# Patient Record
Sex: Female | Born: 1980 | Race: Black or African American | Hispanic: No | Marital: Single | Smoking: Never smoker
Health system: Southern US, Community
[De-identification: ages and names within clinical notes are randomized; demographics above are authoritative.]

## PROBLEM LIST (undated history)

## (undated) DIAGNOSIS — D649 Anemia, unspecified: Secondary | ICD-10-CM

## (undated) HISTORY — PX: BREAST SURGERY: SHX581

## (undated) HISTORY — PX: OTHER SURGICAL HISTORY: SHX169

## (undated) HISTORY — PX: WISDOM TOOTH EXTRACTION: SHX21

---

## 1999-04-10 ENCOUNTER — Other Ambulatory Visit: Admission: RE | Admit: 1999-04-10 | Discharge: 1999-04-10 | Payer: Self-pay | Admitting: *Deleted

## 1999-05-09 ENCOUNTER — Encounter: Payer: Self-pay | Admitting: Obstetrics and Gynecology

## 1999-05-10 ENCOUNTER — Ambulatory Visit (HOSPITAL_COMMUNITY): Admission: RE | Admit: 1999-05-10 | Discharge: 1999-05-10 | Payer: Self-pay | Admitting: Obstetrics and Gynecology

## 1999-05-10 ENCOUNTER — Encounter: Payer: Self-pay | Admitting: Obstetrics and Gynecology

## 1999-06-21 ENCOUNTER — Ambulatory Visit (HOSPITAL_BASED_OUTPATIENT_CLINIC_OR_DEPARTMENT_OTHER): Admission: RE | Admit: 1999-06-21 | Discharge: 1999-06-21 | Payer: Self-pay | Admitting: *Deleted

## 1999-09-25 ENCOUNTER — Other Ambulatory Visit: Admission: RE | Admit: 1999-09-25 | Discharge: 1999-09-25 | Payer: Self-pay | Admitting: Obstetrics and Gynecology

## 1999-10-04 ENCOUNTER — Other Ambulatory Visit: Admission: RE | Admit: 1999-10-04 | Discharge: 1999-10-04 | Payer: Self-pay | Admitting: Obstetrics & Gynecology

## 1999-11-13 ENCOUNTER — Ambulatory Visit (HOSPITAL_COMMUNITY): Admission: RE | Admit: 1999-11-13 | Discharge: 1999-11-13 | Payer: Self-pay | Admitting: Obstetrics & Gynecology

## 1999-11-21 ENCOUNTER — Inpatient Hospital Stay (HOSPITAL_COMMUNITY): Admission: EM | Admit: 1999-11-21 | Discharge: 1999-11-21 | Payer: Self-pay | Admitting: Obstetrics & Gynecology

## 1999-12-13 ENCOUNTER — Emergency Department (HOSPITAL_COMMUNITY): Admission: EM | Admit: 1999-12-13 | Discharge: 1999-12-13 | Payer: Self-pay | Admitting: Emergency Medicine

## 1999-12-14 ENCOUNTER — Encounter: Payer: Self-pay | Admitting: Emergency Medicine

## 2000-11-18 ENCOUNTER — Other Ambulatory Visit: Admission: RE | Admit: 2000-11-18 | Discharge: 2000-11-18 | Payer: Self-pay | Admitting: Plastic Surgery

## 2001-01-05 ENCOUNTER — Other Ambulatory Visit: Admission: RE | Admit: 2001-01-05 | Discharge: 2001-01-05 | Payer: Self-pay | Admitting: Obstetrics and Gynecology

## 2001-05-10 ENCOUNTER — Other Ambulatory Visit: Admission: RE | Admit: 2001-05-10 | Discharge: 2001-05-10 | Payer: Self-pay | Admitting: Obstetrics and Gynecology

## 2001-11-09 ENCOUNTER — Other Ambulatory Visit: Admission: RE | Admit: 2001-11-09 | Discharge: 2001-11-09 | Payer: Self-pay | Admitting: Obstetrics and Gynecology

## 2002-11-30 ENCOUNTER — Other Ambulatory Visit: Admission: RE | Admit: 2002-11-30 | Discharge: 2002-11-30 | Payer: Self-pay | Admitting: Obstetrics and Gynecology

## 2011-11-06 ENCOUNTER — Other Ambulatory Visit: Payer: Self-pay | Admitting: Obstetrics and Gynecology

## 2011-11-06 DIAGNOSIS — Z1231 Encounter for screening mammogram for malignant neoplasm of breast: Secondary | ICD-10-CM

## 2011-11-13 ENCOUNTER — Ambulatory Visit (INDEPENDENT_AMBULATORY_CARE_PROVIDER_SITE_OTHER): Payer: BC Managed Care – PPO | Admitting: Licensed Clinical Social Worker

## 2011-11-13 DIAGNOSIS — F331 Major depressive disorder, recurrent, moderate: Secondary | ICD-10-CM

## 2011-11-13 DIAGNOSIS — F411 Generalized anxiety disorder: Secondary | ICD-10-CM

## 2011-11-17 ENCOUNTER — Encounter: Payer: Self-pay | Admitting: Family

## 2011-11-17 ENCOUNTER — Ambulatory Visit: Payer: Self-pay | Admitting: Licensed Clinical Social Worker

## 2011-11-17 ENCOUNTER — Ambulatory Visit (INDEPENDENT_AMBULATORY_CARE_PROVIDER_SITE_OTHER): Payer: BC Managed Care – PPO | Admitting: Family

## 2011-11-17 VITALS — BP 100/70 | HR 85 | Temp 98.3°F | Ht 62.0 in | Wt 143.0 lb

## 2011-11-17 DIAGNOSIS — F419 Anxiety disorder, unspecified: Secondary | ICD-10-CM

## 2011-11-17 DIAGNOSIS — F3289 Other specified depressive episodes: Secondary | ICD-10-CM

## 2011-11-17 DIAGNOSIS — F329 Major depressive disorder, single episode, unspecified: Secondary | ICD-10-CM

## 2011-11-17 DIAGNOSIS — F411 Generalized anxiety disorder: Secondary | ICD-10-CM

## 2011-11-17 LAB — CBC WITH DIFFERENTIAL/PLATELET
Basophils Absolute: 0 10*3/uL (ref 0.0–0.1)
Basophils Relative: 1 % (ref 0.0–3.0)
Eosinophils Absolute: 0.1 10*3/uL (ref 0.0–0.7)
Eosinophils Relative: 1.8 % (ref 0.0–5.0)
HCT: 36.1 % (ref 36.0–46.0)
Hemoglobin: 12.1 g/dL (ref 12.0–15.0)
Lymphocytes Relative: 36.1 % (ref 12.0–46.0)
Lymphs Abs: 1.4 10*3/uL (ref 0.7–4.0)
MCHC: 33.6 g/dL (ref 30.0–36.0)
MCV: 81.5 fl (ref 78.0–100.0)
Monocytes Absolute: 0.4 10*3/uL (ref 0.1–1.0)
Monocytes Relative: 9.9 % (ref 3.0–12.0)
Neutro Abs: 2 10*3/uL (ref 1.4–7.7)
Neutrophils Relative %: 51.2 % (ref 43.0–77.0)
Platelets: 374 10*3/uL (ref 150.0–400.0)
RBC: 4.43 Mil/uL (ref 3.87–5.11)
RDW: 14.4 % (ref 11.5–14.6)
WBC: 3.9 10*3/uL — ABNORMAL LOW (ref 4.5–10.5)

## 2011-11-17 LAB — TSH: TSH: 1.82 u[IU]/mL (ref 0.35–5.50)

## 2011-11-17 LAB — COMPREHENSIVE METABOLIC PANEL
AST: 16 U/L (ref 0–37)
Albumin: 3.7 g/dL (ref 3.5–5.2)
Alkaline Phosphatase: 51 U/L (ref 39–117)
Potassium: 3.6 mEq/L (ref 3.5–5.1)
Sodium: 137 mEq/L (ref 135–145)
Total Protein: 7.2 g/dL (ref 6.0–8.3)

## 2011-11-17 MED ORDER — CLONAZEPAM 0.5 MG PO TABS: 0.5000 mg | ORAL_TABLET | Freq: Two times a day (BID) | ORAL | Status: DC | PRN

## 2011-11-17 MED ORDER — ESCITALOPRAM OXALATE 10 MG PO TABS: 10.0000 mg | ORAL_TABLET | Freq: Every day | ORAL | Status: DC

## 2011-11-17 NOTE — Patient Instructions (Signed)
Anxiety and Panic Attacks   Your caregiver has informed you that you are having an anxiety or panic attack. There may be many forms of this. Most of the time these attacks come suddenly and without warning. They come at any time of day, including periods of sleep, and at any time of life. They may be strong and unexplained. Although panic attacks are very scary, they are physically harmless. Sometimes the cause of your anxiety is not known. Anxiety is a protective mechanism of the body in its fight or flight mechanism. Most of these perceived danger situations are actually nonphysical situations (such as anxiety over losing a job).   CAUSES   The causes of an anxiety or panic attack are many. Panic attacks may occur in otherwise healthy people given a certain set of circumstances. There may be a genetic cause for panic attacks. Some medications may also have anxiety as a side effect.   SYMPTOMS   Some of the most common feelings are:   Intense terror.   Dizziness, feeling faint.   Hot and cold flashes.   Fear of going crazy.   Feelings that nothing is real.   Sweating.   Shaking.   Chest pain or a fast heartbeat (palpitations).   Smothering, choking sensations.   Feelings of impending doom and that death is near.   Tingling of extremities, this may be from over-breathing.   Altered reality (derealization).   Being detached from yourself (depersonalization).   Several symptoms can be present to make up anxiety or panic attacks.   DIAGNOSIS   The evaluation by your caregiver will depend on the type of symptoms you are experiencing. The diagnosis of anxiety or panic attack is made when no physical illness can be determined to be a cause of the symptoms.   TREATMENT   Treatment to prevent anxiety and panic attacks may include:   Avoidance of circumstances that cause anxiety.   Reassurance and relaxation.   Regular exercise.   Relaxation therapies, such as yoga.   Psychotherapy with a psychiatrist or therapist.    Avoidance of caffeine, alcohol and illegal drugs.   Prescribed medication.   SEEK IMMEDIATE MEDICAL CARE IF:   You experience panic attack symptoms that are different than your usual symptoms.   You have any worsening or concerning symptoms.   Document Released: 04/07/2005 Document Revised: 03/27/2011 Document Reviewed: 08/09/2009   ExitCare® Patient Information ©2012 [employer].   Depression, Adolescent and Adult   Depression is a true and treatable medical condition. In general there are two kinds of depression:   Depression we all experience in some form. For example depression from the death of a loved one, financial distress or natural disasters will trigger or increase depression.   Clinical depression, on the other hand, appears without an apparent cause or reason. This depression is a disease. Depression may be caused by chemical imbalance in the body and brain or may come as a response to a physical illness. Alcohol and other drugs can cause depression.   DIAGNOSIS   The diagnosis of depression is usually based upon symptoms and medical history.   TREATMENT   Treatments for depression fall into three categories. These are:   Drug therapy. There are many medicines that treat depression. Responses may vary and sometimes trial and error is necessary to determine the best medicines and dosage for a particular patient.   Psychotherapy, also called talking treatments, helps people resolve their problems by looking at them from   a different point of view and by giving people insight into their own personal makeup. Traditional psychotherapy looks at a childhood source of a problem. Other psychotherapy will look at current conflicts and move toward solving those. If the cause of depression is drug use, counseling is available to help abstain. In time the depression will usually improve. If there were underlying causes for the chemical use, they can be addressed.   ECT (electroconvulsive therapy) or shock  treatment is not as commonly used today. It is a very effective treatment for severe suicidal depression. During ECT electrical impulses are applied to the head. These impulses cause a generalized seizure. It can be effective but causes a loss of memory for recent events. Sometimes this loss of memory may include the last several months.   Treat all depression or suicide threats as serious. Obtain professional help. Do not wait to see if serious depression will get better over time without help. Seek help for yourself or those around you.   In the [place] the number to the National Suicide Help Lines With 24 Hour Help Are:   1-800-SUICIDE   1-800-784-2433   Document Released: 04/04/2000 Document Revised: 03/27/2011 Document Reviewed: 11/24/2007   ExitCare® Patient Information ©2012 [employer].

## 2011-11-17 NOTE — Progress Notes (Signed)
  Subjective:    Patient ID: Misty Lopez, female    DOB: 1980/11/22, 31 y.o.   MRN: [id number]  HPI 31 year old AAF, non-smoker is in today with c/o anxiety, depression, chest tightness, and panic attacks over the last month. She reports an increase in stress at work with a new boss. She is employed as a Production designer, theatre/television/film for [employer]. She denies and feelings of helplessness, hopelessness, thoughts of death or dying.    Review of Systems  Constitutional: Negative.   Respiratory: Negative.   Cardiovascular: Negative.   Gastrointestinal: Negative.   Musculoskeletal: Negative.   Skin: Negative.   Neurological: Negative.   Psychiatric/Behavioral: Positive for disturbed wake/sleep cycle. Negative for self-injury. The patient is nervous/anxious.    No past medical history on file.  History   Social History  . Marital Status: Single    Spouse Name: N/A    Number of Children: N/A  . Years of Education: N/A   Occupational History  . Not on file.   Social History Main Topics  . Smoking status: Never Smoker   . Smokeless tobacco: Not on file  . Alcohol Use: Yes  . Drug Use: No  . Sexually Active: Not on file   Other Topics Concern  . Not on file   Social History Narrative  . No narrative on file    Past Surgical History  Procedure Date  . Breast surgery     reduction    No family history on file.  No Known Allergies  Current Outpatient Prescriptions on File Prior to Visit  Medication Sig Dispense Refill  . norethindrone-ethinyl estradiol (JUNEL FE,GILDESS FE,LOESTRIN FE) 1-20 MG-MCG tablet Take 1 tablet by mouth daily.      . clonazePAM (KLONOPIN) 0.5 MG tablet Take 1 tablet (0.5 mg total) by mouth 2 (two) times daily as needed for anxiety.  30 tablet  1  . escitalopram (LEXAPRO) 10 MG tablet Take 1 tablet (10 mg total) by mouth daily.  30 tablet  3    BP 100/70  Pulse 85  Temp 98.3 F (36.8 C) (Oral)  Ht 5\' 2"  (1.575 m)  Wt 143 lb (64.864 kg)  BMI 26.15 kg/m2  SpO2  97%chart    Objective:   Physical Exam  Constitutional: She is oriented to person, place, and time. She appears well-developed and well-nourished.  HENT:  Right Ear: External ear normal.  Left Ear: External ear normal.  Nose: Nose normal.  Mouth/Throat: Oropharynx is clear and moist.  Neck: Normal range of motion. Neck supple. No thyromegaly present.  Cardiovascular: Normal rate, regular rhythm and normal heart sounds.   Pulmonary/Chest: Effort normal and breath sounds normal.  Abdominal: Soft. Bowel sounds are normal.  Neurological: She is alert and oriented to person, place, and time.  Skin: Skin is warm and dry.  Psychiatric: She has a normal mood and affect.          Assessment & Plan:  Assessment: Depression, Anxiety  Plan: Lexapro 10mg  1 tab po QD. Klonopin .5mg  BID as needed. Out of work x 2 weeks. Call the office if symptoms worsen or persist. Recheck as scheduled and sooner as necessary.

## 2011-11-20 ENCOUNTER — Ambulatory Visit
Admission: RE | Admit: 2011-11-20 | Discharge: 2011-11-20 | Disposition: A | Discharge status: Home / Self Care | Payer: BC Managed Care – PPO | Source: Ambulatory Visit | Attending: Obstetrics and Gynecology | Admitting: Obstetrics and Gynecology

## 2011-11-20 DIAGNOSIS — Z1231 Encounter for screening mammogram for malignant neoplasm of breast: Secondary | ICD-10-CM

## 2011-11-24 ENCOUNTER — Ambulatory Visit (INDEPENDENT_AMBULATORY_CARE_PROVIDER_SITE_OTHER): Payer: BC Managed Care – PPO | Admitting: Licensed Clinical Social Worker

## 2011-11-24 DIAGNOSIS — F411 Generalized anxiety disorder: Secondary | ICD-10-CM

## 2011-11-24 DIAGNOSIS — F331 Major depressive disorder, recurrent, moderate: Secondary | ICD-10-CM

## 2011-11-25 ENCOUNTER — Other Ambulatory Visit: Payer: Self-pay | Admitting: Obstetrics and Gynecology

## 2011-11-25 DIAGNOSIS — R928 Other abnormal and inconclusive findings on diagnostic imaging of breast: Secondary | ICD-10-CM

## 2011-12-01 ENCOUNTER — Ambulatory Visit: Payer: BC Managed Care – PPO | Admitting: Family

## 2011-12-03 ENCOUNTER — Ambulatory Visit: Payer: BC Managed Care – PPO | Admitting: Family

## 2011-12-03 ENCOUNTER — Ambulatory Visit: Payer: BC Managed Care – PPO | Admitting: Licensed Clinical Social Worker

## 2011-12-05 ENCOUNTER — Ambulatory Visit (INDEPENDENT_AMBULATORY_CARE_PROVIDER_SITE_OTHER): Payer: BC Managed Care – PPO | Admitting: Licensed Clinical Social Worker

## 2011-12-05 DIAGNOSIS — F411 Generalized anxiety disorder: Secondary | ICD-10-CM

## 2011-12-05 DIAGNOSIS — F331 Major depressive disorder, recurrent, moderate: Secondary | ICD-10-CM

## 2011-12-15 ENCOUNTER — Ambulatory Visit (INDEPENDENT_AMBULATORY_CARE_PROVIDER_SITE_OTHER): Payer: BC Managed Care – PPO | Admitting: Family

## 2011-12-15 ENCOUNTER — Encounter: Payer: Self-pay | Admitting: Family

## 2011-12-15 VITALS — BP 122/82 | Temp 98.8°F | Wt 144.0 lb

## 2011-12-15 DIAGNOSIS — F329 Major depressive disorder, single episode, unspecified: Secondary | ICD-10-CM

## 2011-12-15 DIAGNOSIS — F411 Generalized anxiety disorder: Secondary | ICD-10-CM

## 2011-12-15 DIAGNOSIS — F419 Anxiety disorder, unspecified: Secondary | ICD-10-CM

## 2011-12-15 MED ORDER — ESCITALOPRAM OXALATE 10 MG PO TABS: 20.0000 mg | ORAL_TABLET | Freq: Every day | ORAL | Status: DC

## 2011-12-15 NOTE — Progress Notes (Signed)
  Subjective:    Patient ID: Misty Lopez, female    DOB: 1981-01-12, 31 y.o.   MRN: [id number]  HPI 31 year old [place] American female, nonsmoker is in for recheck of anxiety and depression. She was started on Lexapro 10 mg once daily. Patient denies current medications a week after her office visit here. She's been on medication for about 3 weeks consistently. She's taking Klonopin on an as-needed basis has taken about 5 tablets in 3 weeks. He'll psych her mood overall has improved but continues to have good days and bad days. Her mother had a stroke 2 weeks ago and is improving. She lives in [place]. 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 This constellation to return to work. She is seeing Judithe Modest for therapy.   Review of Systems  Constitutional: Negative.   Respiratory: Negative.   Cardiovascular: Negative.   Hematological: Negative.   Psychiatric/Behavioral: Positive for disturbed wake/sleep cycle. Negative for suicidal ideas and confusion. The patient is nervous/anxious.    No past medical history on file.  History   Social History  . Marital Status: Single    Spouse Name: N/A    Number of Children: N/A  . Years of Education: N/A   Occupational History  . Not on file.   Social History Main Topics  . Smoking status: Never Smoker   . Smokeless tobacco: Not on file  . Alcohol Use: Yes  . Drug Use: No  . Sexually Active: Not on file   Other Topics Concern  . Not on file   Social History Narrative  . No narrative on file    Past Surgical History  Procedure Date  . Breast surgery     reduction    No family history on file.  No Known Allergies  Current Outpatient Prescriptions on File Prior to Visit  Medication Sig Dispense Refill  . clonazePAM (KLONOPIN) 0.5 MG tablet Take 1 tablet (0.5 mg total) by mouth 2 (two) times daily as needed for anxiety.  30 tablet  1  . norethindrone-ethinyl  estradiol (JUNEL FE,GILDESS FE,LOESTRIN FE) 1-20 MG-MCG tablet Take 1 tablet by mouth daily.      Marland Kitchen DISCONTD: escitalopram (LEXAPRO) 10 MG tablet Take 1 tablet (10 mg total) by mouth daily.  30 tablet  3    BP 122/82  Temp 98.8 F (37.1 C) (Oral)  Wt 144 lb (65.318 kg)  LMP 08/17/2013chart    Objective:   Physical Exam  Constitutional: She is oriented to person, place, and time. She appears well-developed and well-nourished.  Cardiovascular: Normal rate, regular rhythm and normal heart sounds.   Pulmonary/Chest: Effort normal and breath sounds normal.  Neurological: She is alert and oriented to person, place, and time.  Skin: Skin is warm and dry.  Psychiatric: She has a normal mood and affect.          Assessment & Plan:  Assessment: Anxiety and Depression  Plan:Recheck in 3 weeks. Increase Lexapro to 20mg  once a day. Klonopin as needed.

## 2011-12-15 NOTE — Patient Instructions (Addendum)

## 2011-12-19 ENCOUNTER — Ambulatory Visit (INDEPENDENT_AMBULATORY_CARE_PROVIDER_SITE_OTHER): Payer: BC Managed Care – PPO | Admitting: Licensed Clinical Social Worker

## 2011-12-19 DIAGNOSIS — F411 Generalized anxiety disorder: Secondary | ICD-10-CM

## 2011-12-19 DIAGNOSIS — F331 Major depressive disorder, recurrent, moderate: Secondary | ICD-10-CM

## 2011-12-29 ENCOUNTER — Ambulatory Visit: Payer: BC Managed Care – PPO | Admitting: Licensed Clinical Social Worker

## 2012-01-05 ENCOUNTER — Encounter: Payer: Self-pay | Admitting: Family

## 2012-01-05 ENCOUNTER — Ambulatory Visit (INDEPENDENT_AMBULATORY_CARE_PROVIDER_SITE_OTHER): Payer: BC Managed Care – PPO | Admitting: Family

## 2012-01-05 ENCOUNTER — Ambulatory Visit: Payer: BC Managed Care – PPO | Admitting: Licensed Clinical Social Worker

## 2012-01-05 VITALS — BP 114/80 | HR 95 | Wt 144.0 lb

## 2012-01-05 DIAGNOSIS — F419 Anxiety disorder, unspecified: Secondary | ICD-10-CM

## 2012-01-05 DIAGNOSIS — F329 Major depressive disorder, single episode, unspecified: Secondary | ICD-10-CM

## 2012-01-05 DIAGNOSIS — F341 Dysthymic disorder: Secondary | ICD-10-CM

## 2012-01-05 DIAGNOSIS — F418 Other specified anxiety disorders: Secondary | ICD-10-CM | POA: Insufficient documentation

## 2012-01-05 DIAGNOSIS — F411 Generalized anxiety disorder: Secondary | ICD-10-CM

## 2012-01-05 NOTE — Progress Notes (Signed)
Subjective:    Patient ID: Misty Lopez, female    DOB: 22-Mar-1981, 31 y.o.   MRN: [id number]  HPI 31 year old AAF with c/o of depression and anxiety related to stress at work. Pt was increased to Lexapro 20mg  daily and PRN Klonopin. Reports not taking Lexapro and taking Klonopin daily. Stopped Lexapro without taper. Depression worse. Pt has been on leave from work approx. 6 weeks and continues to experience periods stress when thinking about works and speaking with coworkers. Pt states she was listening to family members who object to her use of medication to deal with stress. Reports increase in appetite, decrease in energy and experiencing moment of panic when trying to complete daily activities. Also reports that co-worker committed suicide  2 weeks ago.    Review of Systems  Constitutional: Positive for activity change, appetite change and fatigue.  HENT: Negative.   Eyes: Negative.   Respiratory: Negative.   Cardiovascular: Negative.   Gastrointestinal: Negative.   Genitourinary: Negative.   Musculoskeletal: Negative.   Skin: Negative.   Neurological: Negative.   Hematological: Negative.   Psychiatric/Behavioral: Negative.    No past medical history on file.  History   Social History  . Marital Status: Single    Spouse Name: N/A    Number of Children: N/A  . Years of Education: N/A   Occupational History  . Not on file.   Social History Main Topics  . Smoking status: Never Smoker   . Smokeless tobacco: Not on file  . Alcohol Use: Yes  . Drug Use: No  . Sexually Active: Not on file   Other Topics Concern  . Not on file   Social History Narrative  . No narrative on file    Past Surgical History  Procedure Date  . Breast surgery     reduction    No family history on file.  No Known Allergies  Current Outpatient Prescriptions on File Prior to Visit  Medication Sig Dispense Refill  . escitalopram (LEXAPRO) 10 MG tablet Take 2 tablets (20 mg total) by  mouth daily.  30 tablet  3  . LO LOESTRIN FE 1 MG-10 MCG / 10 MCG tablet       . norethindrone-ethinyl estradiol (JUNEL FE,GILDESS FE,LOESTRIN FE) 1-20 MG-MCG tablet Take 1 tablet by mouth daily.      . clonazePAM (KLONOPIN) 0.5 MG tablet Take 1 tablet (0.5 mg total) by mouth 2 (two) times daily as needed for anxiety.  30 tablet  1    BP 114/80  Pulse 95  Wt 144 lb (65.318 kg)  SpO2 99%  LMP 08/17/2013chart    Objective:   Physical Exam  Constitutional: She is oriented to person, place, and time. She appears well-developed and well-nourished.  HENT:  Head: Normocephalic.  Right Ear: External ear normal.  Left Ear: External ear normal.  Eyes: EOM are normal. Pupils are equal, round, and reactive to light.  Neck: Normal range of motion. Neck supple.  Cardiovascular: Normal rate, regular rhythm and normal heart sounds.   Pulmonary/Chest: Effort normal and breath sounds normal.  Abdominal: Soft. Bowel sounds are normal.  Musculoskeletal: Normal range of motion.  Neurological: She is alert and oriented to person, place, and time.  Skin: Skin is warm and dry.  Psychiatric:       Dysphoric          Assessment & Plan:   Assessment; Depression, Anxiety  Plan; Extend leave from work for 6 weeks. Pt agrees to  begin to take Lexapro. Follow up 6 weeks or sooner is symptoms persist, become worst. Continue seeing therapist.

## 2012-01-06 DIAGNOSIS — Z0279 Encounter for issue of other medical certificate: Secondary | ICD-10-CM

## 2012-01-14 ENCOUNTER — Ambulatory Visit
Admission: RE | Admit: 2012-01-14 | Discharge: 2012-01-14 | Disposition: A | Discharge status: Home / Self Care | Payer: BC Managed Care – PPO | Source: Ambulatory Visit | Attending: Obstetrics and Gynecology | Admitting: Obstetrics and Gynecology

## 2012-01-14 DIAGNOSIS — R928 Other abnormal and inconclusive findings on diagnostic imaging of breast: Secondary | ICD-10-CM

## 2012-02-16 ENCOUNTER — Encounter: Payer: Self-pay | Admitting: Family

## 2012-02-16 ENCOUNTER — Ambulatory Visit (INDEPENDENT_AMBULATORY_CARE_PROVIDER_SITE_OTHER): Payer: BC Managed Care – PPO | Admitting: Family

## 2012-02-16 VITALS — BP 112/78 | HR 100 | Wt 146.0 lb

## 2012-02-16 DIAGNOSIS — F411 Generalized anxiety disorder: Secondary | ICD-10-CM

## 2012-02-16 DIAGNOSIS — F419 Anxiety disorder, unspecified: Secondary | ICD-10-CM

## 2012-02-16 DIAGNOSIS — F329 Major depressive disorder, single episode, unspecified: Secondary | ICD-10-CM

## 2012-02-16 NOTE — Progress Notes (Signed)
  Subjective:    Patient ID: Misty Lopez, female    DOB: June 14, 1980, 31 y.o.   MRN: [id number]  HPI 31 year old [place] American female, nonsmoker is in for recheck of anxiety and depression. She's currently takes Lexapro that has helped to decrease the frequency of panic attacks. She now has panic attacks 1 every 2-3 weeks. She's also been started on Remeron 15 mg to help him sleep and tolerating it well. Uses Klonopin as needed. Patient still feels like she has anxiety at the thoughts of returning back to work. Has no feelings of helplessness hopelessness, thoughts of death or dying.   Review of Systems  Constitutional: Negative.   Respiratory: Negative.   Cardiovascular: Negative.   Gastrointestinal: Negative.   Hematological: Negative.   Psychiatric/Behavioral: Positive for disturbed wake/sleep cycle and agitation. Negative for suicidal ideas and self-injury. The patient is nervous/anxious.    No past medical history on file.  History   Social History  . Marital Status: Single    Spouse Name: N/A    Number of Children: N/A  . Years of Education: N/A   Occupational History  . Not on file.   Social History Main Topics  . Smoking status: Never Smoker   . Smokeless tobacco: Not on file  . Alcohol Use: Yes  . Drug Use: No  . Sexually Active: Not on file   Other Topics Concern  . Not on file   Social History Narrative  . No narrative on file    Past Surgical History  Procedure Date  . Breast surgery     reduction    No family history on file.  No Known Allergies  Current Outpatient Prescriptions on File Prior to Visit  Medication Sig Dispense Refill  . escitalopram (LEXAPRO) 10 MG tablet Take 2 tablets (20 mg total) by mouth daily.  30 tablet  3  . LO LOESTRIN FE 1 MG-10 MCG / 10 MCG tablet       . clonazePAM (KLONOPIN) 0.5 MG tablet Take 1 tablet (0.5 mg total) by mouth 2 (two) times daily as needed for anxiety.  30 tablet  1  . mirtazapine (REMERON) 15 MG  tablet       . norethindrone-ethinyl estradiol (JUNEL FE,GILDESS FE,LOESTRIN FE) 1-20 MG-MCG tablet Take 1 tablet by mouth daily.        BP 112/78  Pulse 100  Wt 146 lb (66.225 kg)  SpO2 98%chart    Objective:   Physical Exam  Constitutional: She is oriented to person, place, and time. She appears well-developed and well-nourished.  Pulmonary/Chest: Effort normal.  Neurological: She is alert and oriented to person, place, and time.  Psychiatric: She has a normal mood and affect.          Assessment & Plan:  Assessment: Anxiety and Depression  Plan: Continue current meds. See psychiatry for further management. I will manage any other medical needs.

## 2012-03-10 ENCOUNTER — Ambulatory Visit (INDEPENDENT_AMBULATORY_CARE_PROVIDER_SITE_OTHER): Payer: BC Managed Care – PPO | Admitting: Psychiatry

## 2012-03-10 DIAGNOSIS — F4322 Adjustment disorder with anxiety: Secondary | ICD-10-CM

## 2012-03-10 MED ORDER — LORAZEPAM 0.5 MG PO TABS: ORAL_TABLET | ORAL | Status: DC

## 2012-03-10 NOTE — Progress Notes (Signed)
Psychiatric Assessment Adult  Patient Identification:  Misty Lopez Date of Evaluation:  03/10/2012 Chief Complaint:Anxiety History of Chief Complaint:  No chief complaint on file. today this 31 year old African American female was seen in a psychiatric evaluation. This patient acknowledges episodic anxiety attacks occurring now approximately once a week. The patient says anxiety is sometimes associated with chest pain and sometimes with shortness of breath. It is very evident that there is a significant stressor occurring since July when all this started. Over the last number of months she has been confronted by a Agricultural consultant who is her boss about changing her employees and changing the way she works. 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 The patient believes this is not which should happen in in principal refuses to acknowledge his demands. In July when she felt more pressure she became extremely anxious and maneuvered her primary care doctor to write her out for 2 weeks. On the day that she was written now hours later she went shopping and had an acute anxiety attack. He was characterized by shortness of breath feeling very depressed crying a great deal and being paralyzed with anxiety for least 30-45 minutes. He clearly was not a panic attack. Approximately a week later her mother had a stroke. At this time the patient has been renown by her primary care provider. She was told by the disability company that she has stopped seeing her therapist and instead see a psychiatrist to determine the need to continue her disability. At that time she apparently went to some setting in [place] where she saw a psychiatrist to short times and saw 3 different therapists at 3 different times. The patient is very frustrated with her psychiatric care in [place] and has chosen to come here to be evaluated. At one point she saw Misty Lopez a local therapist but no  longer. This patient has never been married has no children. 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 She likes her job and likes her employees who work for her. She's a great dislike and distrust for her district manager was been threatening to her about requiring changes. She is going to human resources without success. Generally the job as a secure 1. The patient presently has no romantic relationships but has a number of close social friends. The patient denies persistent daily depression. She says her sleep is somewhat disrupted but it does not affect her daytime functioning. In essence she does not take naps nor does she feel sleepy. Her appetite is normal as is her energy. She denies anhedonia she enjoys a social life watching TV and her dog. She denies any problems with her ability to think and concentrate or make decisions. She denies worthlessness. She denies being suicidal now or ever. The patient denies the use of alcohol or drugs. She is no evidence of psychotic symptoms. She denies a clear episode in the past of major depression or of mania. She denies symptoms consistent with generalized anxiety disorder or of panic disorder. She denies obsessive-compulsive symptoms. Masses she has mild anxiety with an anxiety attack occurs approximately once a week. Her primary care doctor has placed her on Lexapro at low dose and Remeron neither of which have had any benefit.  HPI Review of Systems Physical Exam  Depressive Symptoms: anxiety,  (Hypo) Manic Symptoms:   Elevated Mood:  No Irritable Mood:  No Grandiosity:  No Distractibility:  No Labiality of Mood:  No Delusions:  No Hallucinations:  No Impulsivity:  No Sexually Inappropriate Behavior:  No Financial Extravagance:  No Flight of Ideas:  No  Anxiety Symptoms: Excessive Worry:  No Panic Symptoms:  No Agoraphobia:  No Obsessive Compulsive: No  Symptoms: None, Specific Phobias:  No Social Anxiety:   No  Psychotic Symptoms:  Hallucinations: No None Delusions:  No Paranoia:  No   Ideas of Reference:  No  PTSD Symptoms: Ever had a traumatic exposure:  No Had a traumatic exposure in the last month:  No Re-experiencing:  None Hypervigilance:  No Hyperarousal: No None Avoidance: No None  Traumatic Brain Injury: No   Past Psychiatric History: Diagnosis: none  Hospitalizations:   Outpatient Care:   Substance Abuse Care:   Self-Mutilation:   Suicidal Attempts:   Violent Behaviors:    Past Medical History:  No past medical history on file. History of Loss of Consciousness:   Seizure History:   Cardiac History:   Allergies:  No Known Allergies Current Medications:  Current Outpatient Prescriptions  Medication Sig Dispense Refill  . clonazePAM (KLONOPIN) 0.5 MG tablet Take 1 tablet (0.5 mg total) by mouth 2 (two) times daily as needed for anxiety.  30 tablet  1  . escitalopram (LEXAPRO) 10 MG tablet Take 2 tablets (20 mg total) by mouth daily.  30 tablet  3  . LO LOESTRIN FE 1 MG-10 MCG / 10 MCG tablet       . mirtazapine (REMERON) 15 MG tablet       . norethindrone-ethinyl estradiol (JUNEL FE,GILDESS FE,LOESTRIN FE) 1-20 MG-MCG tablet Take 1 tablet by mouth daily.        Previous Psychotropic Medications:  Medication Dose                          Substance Abuse History in the last 12 months: Substance Age of 1st Use Last Use Amount Specific Type    Medical Consequences of Substance Abuse:   Legal Consequences of Substance Abuse:   Family Consequences of Substance Abuse:   Blackouts:   DT's:   Withdrawal Symptoms:   None  Social History: Current Place of Residence: [place] of Birth:  Family Members:  Marital Status:  Single Children: none  Sons:   Daughters:  Relationships:  Education:  [employer] Problems/Performance:  Religious Beliefs/Practices:  History of Abuse: none Teacher, music History:    Legal History:  Hobbies/Interests:   Family History:  No family history on file.  Mental Status Examination/Evaluation: Objective:  Appearance: Casual  Eye Contact::  Good  Speech:  Normal Rate  Volume:  Normal  Mood:  normal  Affect:  Appropriate  Thought Process:  Coherent  Orientation:  Full  Thought Content:  WDL  Suicidal Thoughts:  No  Homicidal Thoughts:  No  Judgement:  Good  Insight:  Good  Psychomotor Activity:  Normal  Akathisia:  No  Handed:  Right  AIMS (if indicated):    Assets:  Desire for Improvement    Laboratory/X-Ray Psychological Evaluation(s)        Assessment:  Axis I: Adjustment Disorder with Anxiety  AXIS I Adjustment Disorder with Anxiety  AXIS II Deferred  AXIS III No past medical history on file.   AXIS IV occupational problems  AXIS V 51-60 moderate symptoms   Treatment Plan/Recommendations:  Plan of Care: at this time I believe the patient has situational anxiety. I do not believe she has plans for what  happened if she was terminated from her present employer. I think she is a single woman who is very anxious about losing this job. Fortunately her mother has recovered. At this time or as per did discontinue her Lexapro and her Remeron. Today we'll start her on Ativan 0.5 mg twice a day and 0.5 mg when necessary if she has an anxiety attack. At this time we've asked her to go back to [facility] for therapy. We've been fairly clear to her that this setting is not a place to make disability evaluations. This is setting for diagnosis and treatment. It is possible after long-term relationship to right individuals out for disability but I do not think this patient is truly disabled from any major mental illness. She's agreed to go back to her primary care provider her initially birder out to continue writing her out. At this time her chronic state of anxiety is not disabling and I do not believe it should prevent her from returning to her work setting.   Laboratory:    Psychotherapy: none  Medications: Lexapro 10 mg, Remeron 15 mg  Routine PRN Medications:    Consultations:   Safety Concerns:    Other:      Juanice Warburton, [place], MD 11/20/20131:54 PM

## 2012-04-06 ENCOUNTER — Telehealth: Payer: Self-pay | Admitting: Family

## 2012-04-06 NOTE — Telephone Encounter (Signed)
Ok for patient to pick up a note. Written permission has to be given to fax directly

## 2012-04-06 NOTE — Telephone Encounter (Signed)
Pt is requesting a work note sent to her job stating the reason she was taking out of work on 11/17/11.  please fax attn: Christel Mormon fax # is [phone].

## 2012-04-06 NOTE — Telephone Encounter (Signed)
Left message to advise pt of NP's note

## 2012-05-05 ENCOUNTER — Ambulatory Visit (HOSPITAL_COMMUNITY): Payer: BC Managed Care – PPO | Admitting: Psychiatry

## 2013-10-05 ENCOUNTER — Ambulatory Visit: Payer: Self-pay | Admitting: Family

## 2013-10-07 ENCOUNTER — Ambulatory Visit: Payer: 59 | Admitting: Family

## 2013-10-07 DIAGNOSIS — Z0289 Encounter for other administrative examinations: Secondary | ICD-10-CM

## 2013-11-03 ENCOUNTER — Other Ambulatory Visit: Payer: Self-pay | Admitting: Obstetrics and Gynecology

## 2013-11-03 DIAGNOSIS — D259 Leiomyoma of uterus, unspecified: Secondary | ICD-10-CM

## 2013-11-04 ENCOUNTER — Other Ambulatory Visit: Payer: Self-pay | Admitting: Obstetrics and Gynecology

## 2013-11-04 ENCOUNTER — Ambulatory Visit
Admission: RE | Admit: 2013-11-04 | Discharge: 2013-11-04 | Disposition: A | Discharge status: Home / Self Care | Payer: 59 | Source: Ambulatory Visit | Attending: Obstetrics and Gynecology | Admitting: Obstetrics and Gynecology

## 2013-11-04 DIAGNOSIS — D259 Leiomyoma of uterus, unspecified: Secondary | ICD-10-CM

## 2013-11-04 MED ORDER — IOHEXOL 300 MG/ML  SOLN: 100.0000 mL | Freq: Once | INTRAMUSCULAR | Status: DC | PRN

## 2014-01-05 ENCOUNTER — Other Ambulatory Visit: Payer: Self-pay

## 2014-01-11 ENCOUNTER — Encounter: Payer: Self-pay | Admitting: Family

## 2014-01-11 DIAGNOSIS — Z0289 Encounter for other administrative examinations: Secondary | ICD-10-CM

## 2014-12-03 IMAGING — CT CT ABD-PELV W/O CM
2 of 4 series · 17 of 46 positions shown, 19 images · non-contrast
Comparison: None.

CLINICAL DATA: Unable is a obtain IV access. Abdominal drain.
Uterine fibroids

EXAM:
CT ABDOMEN AND PELVIS WITHOUT CONTRAST
TECHNIQUE: Multidetector CT imaging of the abdomen and pelvis was performed
following the standard protocol without IV contrast.

[Series 2: abd/pelvis without · axial · non-contrast · 0.70mm/px · z∈[-394,-54]mm · 14 of 76 slices shown, 16 images]
[im 4/76  soft-tissue]
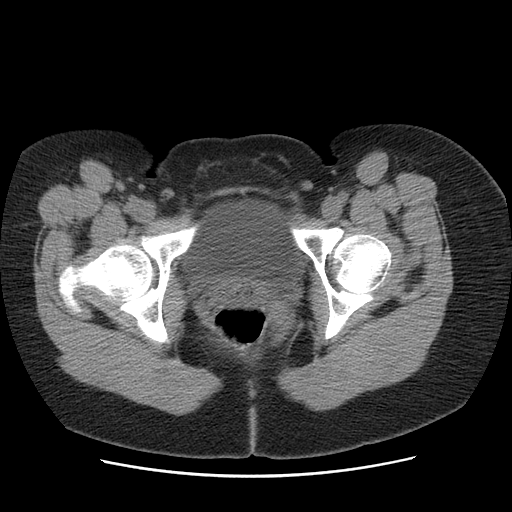
[im 4/76  bone]
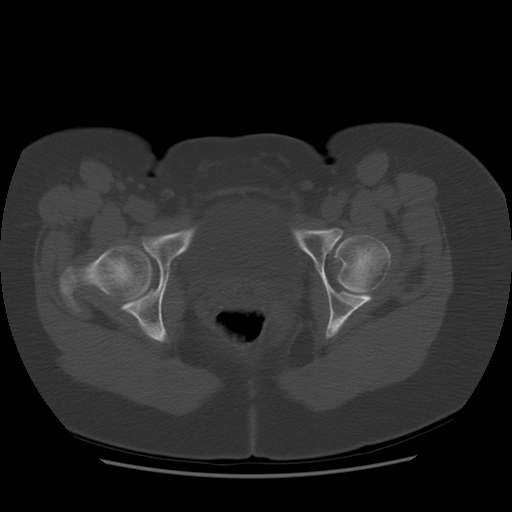
[im 10/76  soft-tissue]
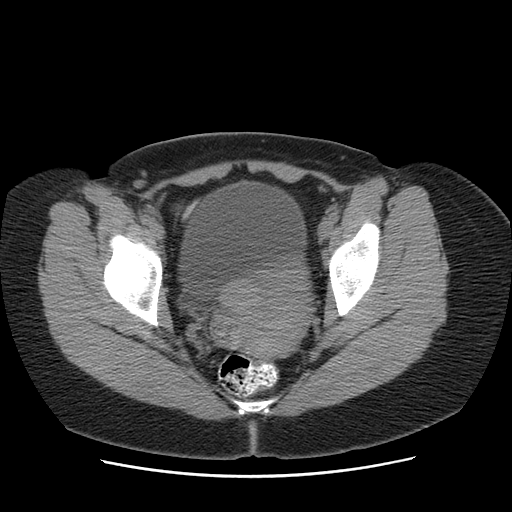
[im 14/76  soft-tissue]
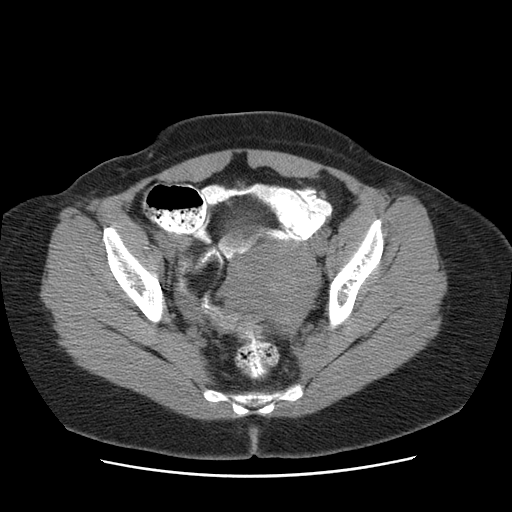
[im 20/76  soft-tissue]
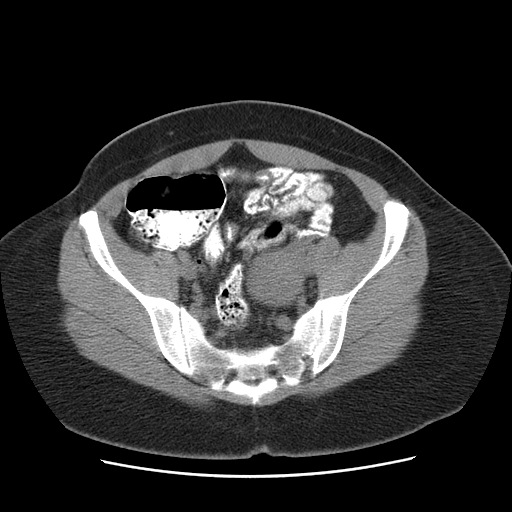
[im 27/76  soft-tissue]
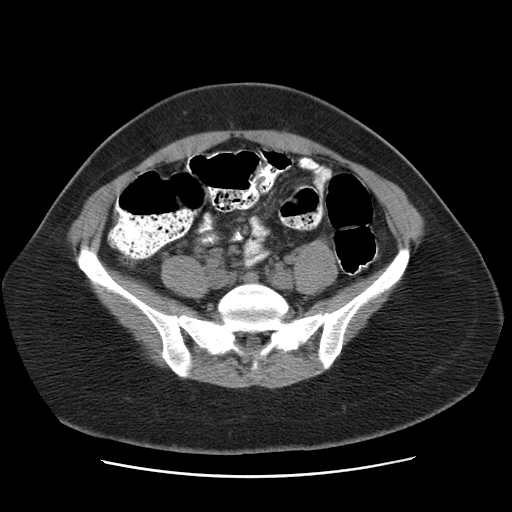
[im 30/76  soft-tissue]
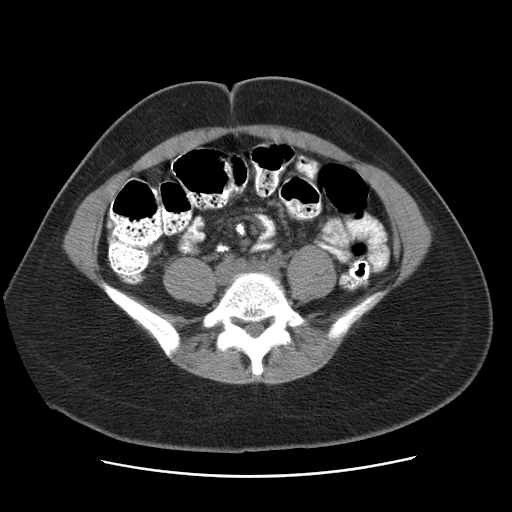
[im 36/76  soft-tissue]
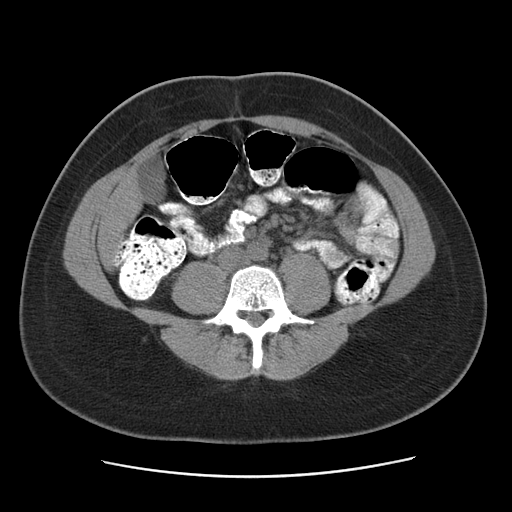
[im 40/76  soft-tissue]
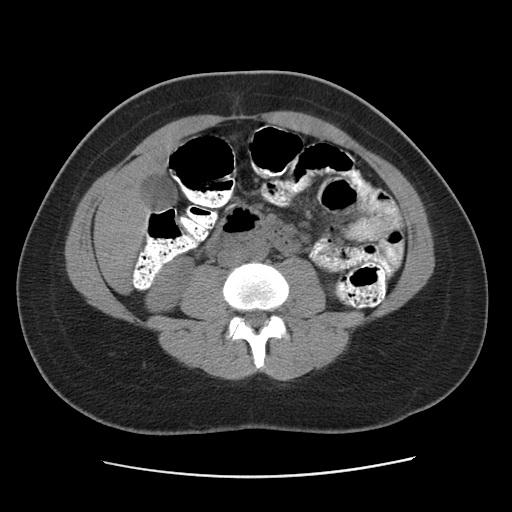
[im 46/76  soft-tissue]
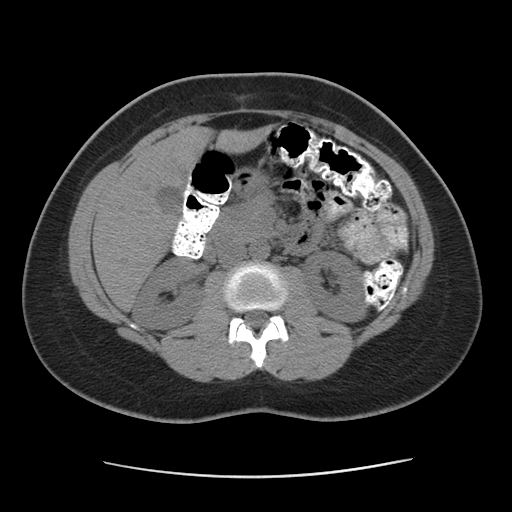
[im 46/76  bone]
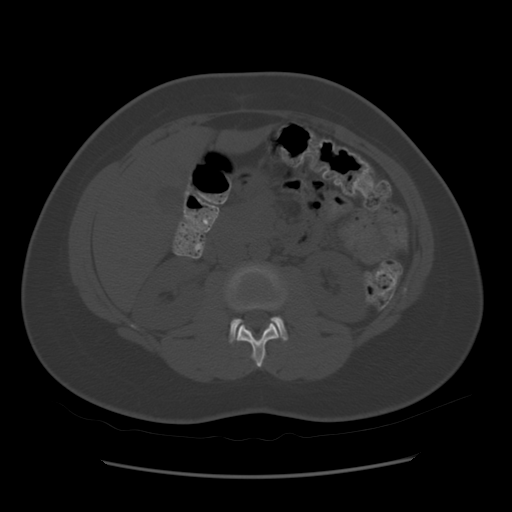
[im 49/76  soft-tissue]
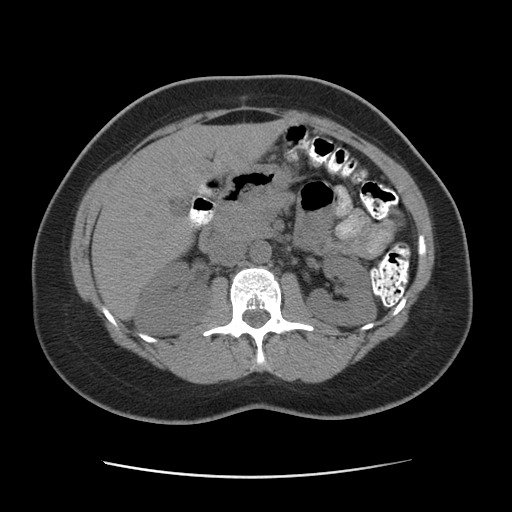
[im 56/76  soft-tissue]
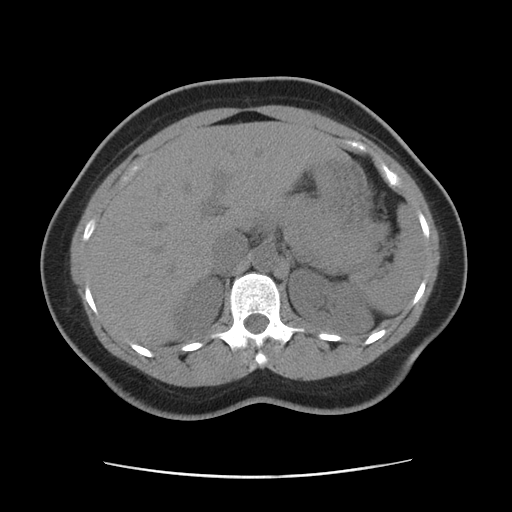
[im 62/76  soft-tissue]
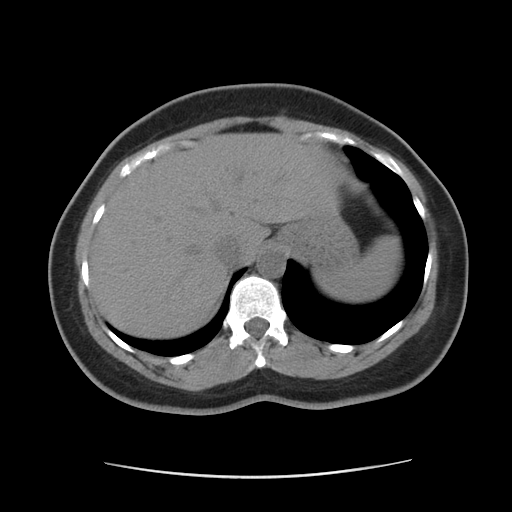
[im 66/76  soft-tissue]
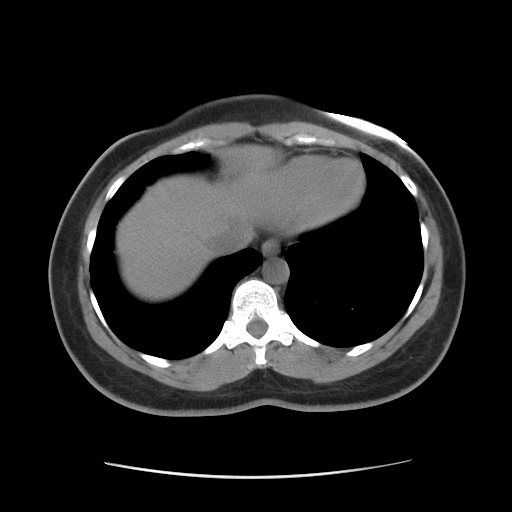
[im 72/76  soft-tissue]
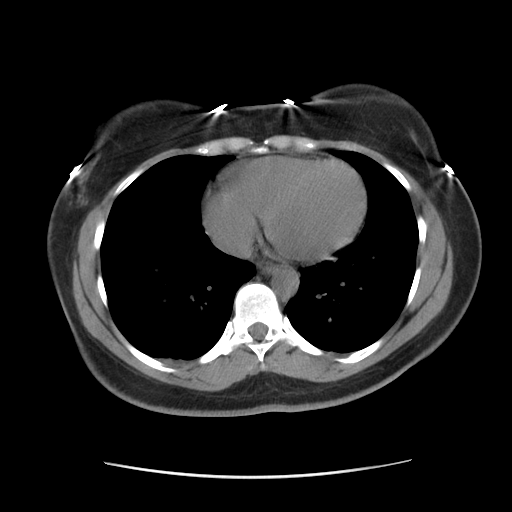

[Series 400: cor · coronal · 0.94mm/px · 3 of 144 slices shown]
[im 48/144  soft-tissue]
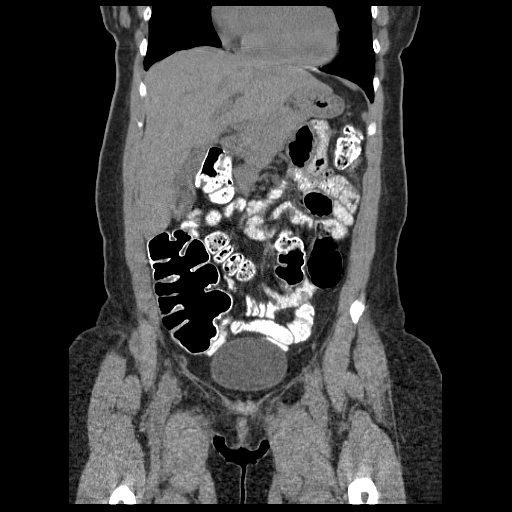
[im 64/144  soft-tissue]
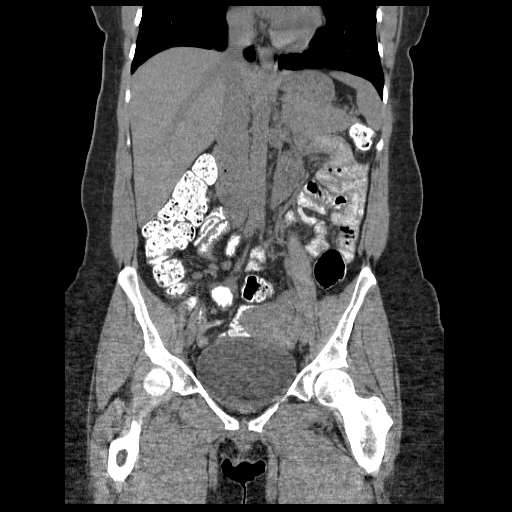
[im 80/144  soft-tissue]
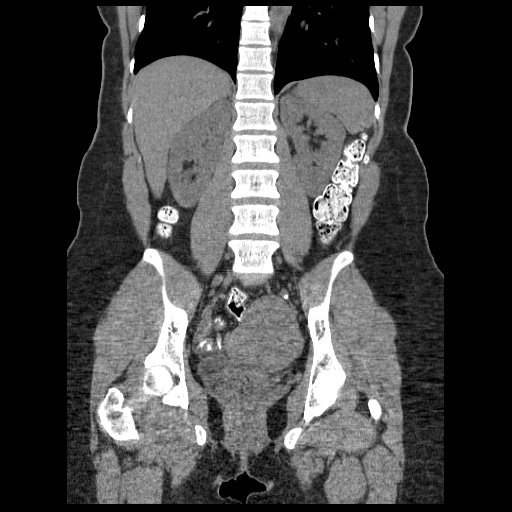

[17 of 46 positions shown; findings below may reference images not displayed]

FINDINGS: Uterus: Uterus has a lobular consistent with leiomyomas. These
leiomyomas are not well demonstrate without IV contrast. There is
one partially exophytic leiomyoma along the left border of the urine
body. The others are likely intramural. The uterus is normal in
volume at the 9.9 by 6.6 by 5.9 cm. The endometrial canal not
assessed.

Lung bases are clear. No pericardial fluid. No focal hepatic lesion
on this noncontrast exam. The gallbladder, pancreas, spleen, adrenal
glands, kidneys are normal.

The stomach, small bowel, and cecum are normal. The appendix is
normal. The colon and rectosigmoid colon are normal.

Abdominal or is normal caliber. No retroperitoneal periportal
lymphadenopathy.

No free fluid the pelvis. Bladder is normal. No pelvic
lymphadenopathy. No aggressive osseous lesion.
IMPRESSION: Lobular uterus consistent with intramural and partially exophytic
leiomyomas. The uterus is not well evaluated on this noncontrast CT.
MRI or pelvic ultrasound would be better choices for evaluation of
the uterus.

No acute abdominal process otherwise.

Normal appendix.

## 2015-02-20 ENCOUNTER — Inpatient Hospital Stay (HOSPITAL_COMMUNITY)
Admission: AD | Admit: 2015-02-20 | Discharge: 2015-02-20 | Disposition: A | Discharge status: Home / Self Care | Payer: Managed Care, Other (non HMO) | Source: Ambulatory Visit | Attending: Obstetrics | Admitting: Obstetrics

## 2015-02-20 ENCOUNTER — Encounter (HOSPITAL_COMMUNITY): Payer: Self-pay

## 2015-02-20 ENCOUNTER — Other Ambulatory Visit: Payer: Self-pay | Admitting: Obstetrics and Gynecology

## 2015-02-20 DIAGNOSIS — D649 Anemia, unspecified: Secondary | ICD-10-CM | POA: Diagnosis not present

## 2015-02-20 HISTORY — DX: Anemia, unspecified: D64.9

## 2015-02-20 MED ORDER — SODIUM CHLORIDE 0.9 % IV SOLN
510.0000 mg | Freq: Once | INTRAVENOUS | Status: AC
  Administered 2015-02-20: 510 mg via INTRAVENOUS
  Filled 2015-02-20: qty 17

## 2015-02-26 ENCOUNTER — Inpatient Hospital Stay (HOSPITAL_COMMUNITY)
Admission: AD | Admit: 2015-02-26 | Discharge: 2015-02-26 | Disposition: A | Discharge status: Home / Self Care | Payer: Managed Care, Other (non HMO) | Source: Ambulatory Visit | Attending: Obstetrics | Admitting: Obstetrics

## 2015-02-26 DIAGNOSIS — D649 Anemia, unspecified: Secondary | ICD-10-CM | POA: Insufficient documentation

## 2015-02-26 MED ORDER — SODIUM CHLORIDE 0.9 % IV SOLN
510.0000 mg | Freq: Once | INTRAVENOUS | Status: AC
  Administered 2015-02-26: 510 mg via INTRAVENOUS
  Filled 2015-02-26: qty 17

## 2015-02-26 MED ORDER — SODIUM CHLORIDE 0.9 % IV SOLN
INTRAVENOUS | Status: DC
  Administered 2015-02-26: 12:00:00 via INTRAVENOUS

## 2015-02-26 NOTE — MAU Note (Signed)
Patient presents for scheduled iron infusion. Denies complications.

## 2018-06-16 ENCOUNTER — Ambulatory Visit
Admission: EM | Admit: 2018-06-16 | Discharge: 2018-06-16 | Disposition: A | Discharge status: Home / Self Care | Payer: Managed Care, Other (non HMO) | Attending: Emergency Medicine | Admitting: Emergency Medicine

## 2018-06-16 DIAGNOSIS — S161XXA Strain of muscle, fascia and tendon at neck level, initial encounter: Secondary | ICD-10-CM | POA: Diagnosis not present

## 2018-06-16 MED ORDER — CYCLOBENZAPRINE HCL 5 MG PO TABS: 5.0000 mg | ORAL_TABLET | Freq: Every day | ORAL | 0 refills | Status: DC

## 2018-06-16 MED ORDER — IBUPROFEN 800 MG PO TABS: 800.0000 mg | ORAL_TABLET | Freq: Three times a day (TID) | ORAL | 0 refills | Status: AC
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
## 2018-06-16 NOTE — Discharge Instructions (Signed)
Light and regular activity as tolerated.  Heat with activity may be helpful.  Ibuprofen three times a day, take with food.  Flexeril as a muscle relaxer as needed. May cause drowsiness. Please do not take if driving or drinking alcohol.   If symptoms worsen or do not improve in the next 2 weeks to return to be seen or to follow up with your PCP.
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
## 2018-07-19 ENCOUNTER — Telehealth: Payer: Managed Care, Other (non HMO) | Admitting: Family

## 2018-07-19 DIAGNOSIS — K047 Periapical abscess without sinus: Secondary | ICD-10-CM

## 2018-07-19 MED ORDER — CLINDAMYCIN HCL 300 MG PO CAPS: 300.0000 mg | ORAL_CAPSULE | Freq: Three times a day (TID) | ORAL | 0 refills | Status: DC

## 2018-07-19 NOTE — Progress Notes (Signed)
I have sent in Clindamycin 300 mg three times a day. If this does not improve, see your doctor

## 2018-07-31 ENCOUNTER — Telehealth: Payer: Managed Care, Other (non HMO) | Admitting: Adult Health

## 2018-07-31 DIAGNOSIS — R599 Enlarged lymph nodes, unspecified: Secondary | ICD-10-CM

## 2018-07-31 DIAGNOSIS — K047 Periapical abscess without sinus: Secondary | ICD-10-CM

## 2018-07-31 NOTE — Progress Notes (Signed)
Based on what you shared with me, I feel your condition warrants further evaluation and I recommend that you be seen for a face to face office visit. I understand you did become some better after the clindamycin but since this condition is not resolved for your safety you should be seen today.      NOTE: If you entered your credit card information for this eVisit, you will not be charged. You may see a "hold" on your card for the $35 but that hold will drop off and you will not have a charge processed.  If you are having a true medical emergency please call 911.  If you need an urgent face to face visit,  has four urgent care centers for your convenience.    PLEASE NOTE: THE [facility]STACARE LOCATIONS AND URGENT CARE CLINICS DO NOT HAVE THE TESTING FOR CORONAVIRUS COVID19 AVAILABLE.  IF YOU FEEL YOU NEED THIS TEST YOU MUST GO TO A TRIAGE LOCATION AT [place]   [email or web address] to reserve your spot online an avoid wait times  [facility] [address] [place], [place] [address] Modified hours of operation: Monday-Friday, 10 AM to 6 PM  Saturday & Sunday 10 AM to 4 PM *Across the street from [place] (New Address!) [address] [place], [place] [address] *Just off [address], across the road from Standard hours of operation: Monday-Friday, 10 AM to 5 PM  Closed Saturday & Sunday   The following sites will take your insurance:  . [facility] [place] a Provider at this Location  [address] [place], [place] [address] . 10 am to 8 pm Monday-Friday . 12 pm to 8 pm Saturday-Sunday   . [facility] at [place] a Provider at this Location  [address] [place], [place], [place] [address] . 8 am to 8 pm Monday-Friday . 9  am to 6 pm Saturday . 11 am to 6 pm Sunday   . [facility] at [place] Get Driving Directions  [address] [place], [place] [address] . 8 am to 8 pm Monday-Friday . 8 am to 4 pm Saturday-Sunday   Your e-visit answers were reviewed by a board certified advanced clinical practitioner to complete your personal care plan.  Thank you for using e-Visits.

## 2018-08-03 ENCOUNTER — Ambulatory Visit
Admission: EM | Admit: 2018-08-03 | Discharge: 2018-08-03 | Disposition: A | Discharge status: Home / Self Care | Payer: Managed Care, Other (non HMO) | Attending: Physician Assistant | Admitting: Physician Assistant

## 2018-08-03 DIAGNOSIS — M542 Cervicalgia: Secondary | ICD-10-CM

## 2018-08-03 MED ORDER — DICLOFENAC SODIUM 1 % TD GEL: 2.0000 g | Freq: Four times a day (QID) | TRANSDERMAL | 0 refills | Status: DC

## 2018-08-03 MED ORDER — METHOCARBAMOL 500 MG PO TABS: 500.0000 mg | ORAL_TABLET | Freq: Two times a day (BID) | ORAL | 0 refills | Status: DC

## 2018-08-03 NOTE — ED Provider Notes (Signed)
[facility]    CSN: [id number] Arrival date & time: 08/03/18  1635     History   Chief Complaint Chief Complaint  Patient presents with  . Dental Pain    HPI Misty Lopez is a 38 y.o. female.   38 year old female comes in for few day history of right neck pain.  Patient states she has a crown that she needs to replace on her right bottom tooth, she weeks ago, she cracked the tooth while eating, and wonders if that could be causing her right neck pain.  She denies any dental pain, swelling of the throat, tripoding, drooling, trismus.  She notes a mass to the right submandibular area where she wonders if it is a lymph node.  She denies any painful palpation.  States for the past few days, she has had increased neck stiffness, and now with pain in decreased range of motion.  Pain is mostly to the right, worse with movement.  She denies any injury/trauma.  Denies radiation of pain.  She does note that she has been waking up in different positions sleeping, and wonders if that could also be causing symptoms.  She tried a pill of leftover Flexeril without relief.  Took ibuprofen 800 mg once without relief.  Denies fever, chills, night sweats.  Denies cough, congestion, sore throat.  Denies headache, photophobia.     Past Medical History:  Diagnosis Date  . Anemia     Patient Active Problem List   Diagnosis Date Noted  . Depression with anxiety 01/05/2012    Past Surgical History:  Procedure Laterality Date  . BREAST SURGERY     reduction  . WISDOM TOOTH EXTRACTION      OB History    Gravida  0   Para      Term      Preterm      AB      Living        SAB      TAB      Ectopic      Multiple      Live Births               Home Medications    Prior to Admission medications   Medication Sig Start Date End Date Taking? Authorizing Provider  Cholecalciferol (VITAMIN D3) 10000 UNITS TABS Take 2 tablets by mouth daily.    [provider]  clindamycin (CLEOCIN) 300 MG capsule Take 1 capsule (300 mg total) by mouth 3 (three) times daily. 07/19/18   Dutch Quint B, FNP  diclofenac sodium (VOLTAREN) 1 % GEL Apply 2 g topically 4 (four) times daily. 08/03/18   Tasia Catchings, Kyrene Longan V, PA-C  Ferrous Sulfate (IRON) 325 (65 FE) MG TABS Take 2 tablets by mouth daily.    [provider]  Hydrocodone-Acetaminophen 5-300 MG TABS Take 1-2 tablets by mouth every 4 (four) hours as needed (for pain).  02/23/15   [provider]  ibuprofen (ADVIL,MOTRIN) 800 MG tablet Take 1 tablet (800 mg total) by mouth 3 (three) times daily. 06/16/18   Zigmund Gottron, NP  medroxyPROGESTERone (DEPO-PROVERA) 150 MG/ML injection Inject 150 mg into the muscle every 3 (three) months.    [provider]  methocarbamol (ROBAXIN) 500 MG tablet Take 1 tablet (500 mg total) by mouth 2 (two) times daily. 08/03/18   Ok Edwards, PA-C    Family History No family history on file.  Social History Social History   Tobacco Use  .  Smoking status: Never Smoker  . Smokeless tobacco: Never Used  Substance Use Topics  . Alcohol use: Yes    Comment: socially   . Drug use: No     Allergies   Patient has no known allergies.   Review of Systems Review of Systems  Reason unable to perform ROS: See HPI as above.     Physical Exam Triage Vital Signs ED Triage Vitals [08/03/18 1643]  Enc Vitals Group     BP (!) 141/92     Pulse Rate 84     Resp 18     Temp 98.3 F (36.8 C)     Temp Source Oral     SpO2 99 %     Weight      Height      Head Circumference      Peak Flow      Pain Score 8     Pain Loc      Pain Edu?      Excl. in [place]?    No data found.  Updated Vital Signs BP (!) 141/92 (BP Location: Left Arm)   Pulse 84   Temp 98.3 F (36.8 C) (Oral)   Resp 18   LMP 07/13/2018   SpO2 99%   Physical Exam Constitutional:      General: She is not in acute distress.    Appearance: She is well-developed. She is not  diaphoretic.  HENT:     Head: Normocephalic and atraumatic.     Mouth/Throat:     Mouth: Mucous membranes are moist.     Pharynx: Oropharynx is clear. Uvula midline.      Comments: No tenderness to palpation of gumline/tooth.  No facial swelling.  Floor of mouth soft to palpation.  Small round mass with hyperpigmentation to the right lower chin/jaw, adjacent to the submandibular lymph node, consistent with scarring from acne/ingrown hair. No fluctuance felt. No tenderness to palpation. No obvious cervical lymphadenopathy.  Eyes:     Conjunctiva/sclera: Conjunctivae normal.     Pupils: Pupils are equal, round, and reactive to light.  Neck:     Comments: No tenderness to palpation of spinous processes.  Tenderness to palpation of right neck.  Decreased flexion to the left due to pain and tightness.  Pulmonary:     Effort: Pulmonary effort is normal. No accessory muscle usage, prolonged expiration or respiratory distress.  Neurological:     Mental Status: She is alert and oriented to person, place, and time.    UC Treatments / Results  Labs (all labs ordered are listed, but only abnormal results are displayed) Labs Reviewed - No data to display  EKG None  Radiology No results found.  Procedures Procedures (including critical care time)  Medications Ordered in UC Medications - No data to display  Initial Impression / Assessment and Plan / UC Course  I have reviewed the triage vital signs and the nursing notes.  Pertinent labs & imaging results that were available during my care of the patient were reviewed by me and considered in my medical decision making (see chart for details).    Discussed with patient, history and exam most consistent with neck strain.  Lower suspicion for dental infection/lymphadenopathy causing symptoms.  Will have patient continue ibuprofen as directed, can supplement with Voltaren gel and Tylenol as needed for further pain relief.  Will discontinue  Effexor at this time, and can try Robaxin as needed.  Warm compress.  Return precautions given.  Patient expresses  understanding and agrees to plan.  Final Clinical Impressions(s) / UC Diagnoses   Final diagnoses:  Neck pain on right side    ED Prescriptions    Medication Sig Dispense Auth. Provider   methocarbamol (ROBAXIN) 500 MG tablet Take 1 tablet (500 mg total) by mouth 2 (two) times daily. 20 tablet Yeshua Stryker V, PA-C   diclofenac sodium (VOLTAREN) 1 % GEL Apply 2 g topically 4 (four) times daily. 1 Tube Tobin Chad, PA-C 08/03/18 1721

## 2018-08-03 NOTE — ED Triage Notes (Signed)
Pt c/o rt lower tooth pain x3wks, now having rt sided neck stiffness and pain x5days

## 2018-08-03 NOTE — Discharge Instructions (Signed)
Start ibuprofen as directed. Voltaren gel/tylenol as needed for further pain relief. Discontinue flexeril at this time. Robaxin as needed, this can make you drowsy, so do not take if you are going to drive, operate heavy machinery, or make important decisions. Ice/heat compresses as needed. This can take up to 3-4 weeks to completely resolve, but you should be feeling better each week. Follow up here or with PCP if symptoms worsen, changes for reevaluation.

## 2019-01-25 ENCOUNTER — Other Ambulatory Visit: Payer: Self-pay

## 2019-01-25 DIAGNOSIS — Z20822 Contact with and (suspected) exposure to covid-19: Secondary | ICD-10-CM

## 2019-01-27 LAB — NOVEL CORONAVIRUS, NAA: SARS-CoV-2, NAA: NOT DETECTED

## 2019-02-05 ENCOUNTER — Ambulatory Visit (INDEPENDENT_AMBULATORY_CARE_PROVIDER_SITE_OTHER)
Admission: RE | Admit: 2019-02-05 | Discharge: 2019-02-05 | Disposition: A | Discharge status: Home / Self Care | Payer: Self-pay | Source: Ambulatory Visit

## 2019-02-05 DIAGNOSIS — L720 Epidermal cyst: Secondary | ICD-10-CM

## 2019-02-05 DIAGNOSIS — L089 Local infection of the skin and subcutaneous tissue, unspecified: Secondary | ICD-10-CM

## 2019-02-05 MED ORDER — DOXYCYCLINE HYCLATE 100 MG PO CAPS
100.0000 mg | ORAL_CAPSULE | Freq: Two times a day (BID) | ORAL | 0 refills | Status: AC
Start: 2019-02-05 — End: 2019-02-12

## 2019-02-05 NOTE — Discharge Instructions (Signed)
Doxycycline twice daily x 1 week Warm compresses   Follow up if symptoms not resolving

## 2019-02-05 NOTE — ED Provider Notes (Addendum)
Virtual Visit via Video Note:  Misty Lopez  initiated request for Telemedicine visit with [facility] team. I connected with Misty Lopez  on 02/05/2019 at 3:24 PM  for a synchronized telemedicine visit using a video enabled HIPPA compliant telemedicine application. I verified that I am speaking with Misty Lopez  using two identifiers.  C , PA-C  was physically located in a [facility] site and Remonica Haycox was located at a different location.   The limitations of evaluation and management by telemedicine as well as the availability of in-person appointments were discussed. Patient was informed that she  may incur a bill ( including co-pay) for this virtual visit encounter. Misty Lopez  expressed understanding and gave verbal consent to proceed with virtual visit.     History of Present Illness:Misty Lopez  is a 38 y.o. female presents for evaluation of possible infected bump.  Patient states that over the past few days she has developed a lesion that looks similar to a whitehead to her right lower jaw.  She states that she has a history of similar to this in the past.  Back in April she was treated with clindamycin for this bump for possible dental abscess as at the time she had had a broken crown and felt more behind her tooth. symptoms did improve, but the bump remained and had this evaluated.  In the past she has tried popping it and states that a thicker material has come out of it.  She states recently her symptoms have returned.  She does have some associated pain with it.  Denies any redness. States that it is soft to touch.  Denies any dental pain.  Denies difficulty swallowing or oral swelling.  Denies difficulty breathing.  Denies neck stiffness.  Denies fevers.  No Known Allergies   Past Medical History:  Diagnosis Date  . Anemia      Social History   Tobacco Use  . Smoking status: Never Smoker  . Smokeless tobacco:  Never Used  Substance Use Topics  . Alcohol use: Yes    Comment: socially   . Drug use: No        Observations/Objective: Physical Exam  Constitutional: She is oriented to person, place, and time and well-developed, well-nourished, and in no distress. No distress.  HENT:  Head: Normocephalic and atraumatic.  Eyes: Pupils are equal, round, and reactive to light.  Neck: Normal range of motion.  Full active range of motion of neck, no neck swelling or erythema noted  Pulmonary/Chest: Effort normal. No respiratory distress.  Speaking in full sentences  Musculoskeletal:     Comments: Moving all extremities appropriately  Neurological: She is alert and oriented to person, place, and time.  Speech clear, face symmetric  Skin:  Patient appears to have superficial enlarged papule to right lower mandibular area with overlying grayish, mild whitish appearance.  Nursing note and vitals reviewed.    Assessment and Plan:    ICD-10-CM   1. 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  We will go ahead and cover with doxycycline to cover any infection that is contributing to increased pain and swelling of recently.  Seems less likely dental abscess given superficial appearance.  We will continue to monitor, discussed following up in person if developing worsening pain or swelling, not resolving.  If symptoms  truly assist discussed may need to follow-up with dermatology if bothersome for removal.  Discussed strict return precautions. Patient verbalized understanding and is agreeable with plan.   Follow Up Instructions:     I discussed the assessment and treatment plan with the patient. The patient was provided an opportunity to ask questions and all were answered. The patient agreed with the plan and demonstrated an understanding of the instructions.   The patient was  advised to call back or seek an in-person evaluation if the symptoms worsen or if the condition fails to improve as anticipated.      Janith Lima, PA-C  02/05/2019 3:24 PM         Debara Pickett C, PA-C 02/05/19 1526    , [place] C, PA-C 02/05/19 1526

## 2019-02-09 ENCOUNTER — Ambulatory Visit
Admission: EM | Admit: 2019-02-09 | Discharge: 2019-02-09 | Disposition: A | Discharge status: Home / Self Care | Payer: Medicaid Other | Attending: Emergency Medicine | Admitting: Emergency Medicine

## 2019-02-09 ENCOUNTER — Telehealth: Payer: Medicaid Other

## 2019-02-09 ENCOUNTER — Other Ambulatory Visit: Payer: Self-pay

## 2019-02-09 DIAGNOSIS — L989 Disorder of the skin and subcutaneous tissue, unspecified: Secondary | ICD-10-CM

## 2019-02-09 MED ORDER — CLINDAMYCIN PHOS-BENZOYL PEROX 1-5 % EX GEL: Freq: Two times a day (BID) | CUTANEOUS | 0 refills | Status: AC

## 2019-02-09 NOTE — ED Triage Notes (Signed)
Pt c/o abscess to rt side of her chin since Monday. tx'd through virtual on Monday with antibiotics but not getting any better.

## 2019-02-09 NOTE — Discharge Instructions (Addendum)
Keep ear clean and dry. May finish course of doxycycline as prescribed. Follow-up with dermatology: They should be calling you to schedule appointment. Return sooner for worsening pain, swelling, redness, fever.

## 2019-02-09 NOTE — ED Provider Notes (Signed)
[facility]    CSN: [id number] Arrival date & time: 02/09/19  1411      History   Chief Complaint Chief Complaint  Patient presents with  . Abscess    HPI Misty Lopez is a 38 y.o. female presenting for right lower jaw skin lesion recheck.  Patient was seen on 02/05/2019 via virtual visit: Please see notes which were reviewed by me at time of appointment.  Patient treated for recurrent skin bump (thought to be infected epithelial inclusion cyst) with doxycycline.  Patient states that she has been taking this as written since Sunday.  Patient states yesterday she had some "really really thick stuff come out of it ".  Patient denies malodor, prolonged bleeding, foreign body.  Patient states that there is a little fleshy but sticking out.  Patient is not add anything to her regimen, been compliant with the antibiotics still.  Patient denies fever, jaw pain, dental pain.  Patient wondering if she can take clindamycin as this is treated better in the past.  Patient has not been followed by dermatology "in years ".   Past Medical History:  Diagnosis Date  . Anemia     Patient Active Problem List   Diagnosis Date Noted  . Depression with anxiety 01/05/2012    Past Surgical History:  Procedure Laterality Date  . BREAST SURGERY     reduction  . WISDOM TOOTH EXTRACTION      OB History    Gravida  0   Para      Term      Preterm      AB      Living        SAB      TAB      Ectopic      Multiple      Live Births               Home Medications    Prior to Admission medications   Medication Sig Start Date End Date Taking? Authorizing Provider  Cholecalciferol (VITAMIN D3) 10000 UNITS TABS Take 2 tablets by mouth daily.    [provider]  clindamycin-benzoyl peroxide (BENZACLIN) gel Apply topically 2 (two) times daily. 02/09/19   Hall-Potvin, [place], PA-C  doxycycline (VIBRAMYCIN) 100 MG capsule Take 1 capsule (100 mg total) by  mouth 2 (two) times daily for 7 days. 02/05/19 02/12/19  Wieters, Hallie C, PA-C  Ferrous Sulfate (IRON) 325 (65 FE) MG TABS Take 2 tablets by mouth daily.    [provider]  ibuprofen (ADVIL,MOTRIN) 800 MG tablet Take 1 tablet (800 mg total) by mouth 3 (three) times daily. 06/16/18   Zigmund Gottron, NP    Family History No family history on file.  Social History Social History   Tobacco Use  . Smoking status: Never Smoker  . Smokeless tobacco: Never Used  Substance Use Topics  . Alcohol use: Yes    Comment: socially   . Drug use: No     Allergies   Patient has no known allergies.   Review of Systems Review of Systems  Constitutional: Negative for fatigue and fever.  HENT: Negative for ear pain, sinus pain, sore throat and voice change.   Eyes: Negative for pain, redness and visual disturbance.  Respiratory: Negative for cough and shortness of breath.   Cardiovascular: Negative for chest pain and palpitations.  Gastrointestinal: Negative for abdominal pain, diarrhea and vomiting.  Musculoskeletal: Negative for arthralgias and myalgias.  Skin: Negative for  color change.       Positive for skin lesion  Neurological: Negative for syncope and headaches.     Physical Exam Triage Vital Signs ED Triage Vitals  Enc Vitals Group     BP      Pulse      Resp      Temp      Temp src      SpO2      Weight      Height      Head Circumference      Peak Flow      Pain Score      Pain Loc      Pain Edu?      Excl. in [place]?    No data found.  Updated Vital Signs BP 123/83 (BP Location: Left Arm)   Pulse 88   Temp 98.3 F (36.8 C) (Oral)   Resp 18   LMP 02/06/2019   SpO2 99%   Visual Acuity Right Eye Distance:   Left Eye Distance:   Bilateral Distance:    Right Eye Near:   Left Eye Near:    Bilateral Near:     Physical Exam Constitutional:      General: She is not in acute distress. HENT:     Head: Normocephalic and atraumatic.  Eyes:      General: No scleral icterus.    Pupils: Pupils are equal, round, and reactive to light.  Cardiovascular:     Rate and Rhythm: Normal rate.  Pulmonary:     Effort: Pulmonary effort is normal.  Skin:    Coloration: Skin is not jaundiced.     Findings: No bruising.     Comments: Small, (<0.5 cm) fleshy lesion protruding from skin.  No active bleeding or discharge.  No surrounding erythema, tenderness, induration/fluctuance.  No TTP.  Neurological:     Mental Status: She is alert and oriented to person, place, and time.      UC Treatments / Results  Labs (all labs ordered are listed, but only abnormal results are displayed) Labs Reviewed - No data to display  EKG   Radiology No results found.  Procedures Procedures (including critical care time)  Medications Ordered in UC Medications - No data to display  Initial Impression / Assessment and Plan / UC Course  I have reviewed the triage vital signs and the nursing notes.  Pertinent labs & imaging results that were available during my care of the patient were reviewed by me and considered in my medical decision making (see chart for details).     Benign, fleshy growth likely related to intralesional pressure.  No discernible abscess to incise today.  Offered to incise lesion, the patient declined after discussing risk/benefit.  We will continue doxycycline course, add clindamycin benzoyl peroxide topically given patient's history of cystic acne.  Internal ambulatory referral made to dermatology as external dermatology has "new patient scheduling after January ".  Low concern for malignancy/infectious process at this time.  Return precautions discussed, patient verbalized understanding and is agreeable to plan. Final Clinical Impressions(s) / UC Diagnoses   Final diagnoses:  Skin lesion     Discharge Instructions     Keep ear clean and dry. May finish course of doxycycline as prescribed. Follow-up with dermatology: They  should be calling you to schedule appointment. Return sooner for worsening pain, swelling, redness, fever.    ED Prescriptions    Medication Sig Dispense Auth. Provider   clindamycin-benzoyl peroxide (BENZACLIN) gel  Apply topically 2 (two) times daily. 25 g Hall-Potvin, [place], PA-C     PDMP not reviewed this encounter.   Hall-Potvin, [place], [place] 02/09/19 1557

## 2019-02-10 ENCOUNTER — Telehealth: Payer: Self-pay | Admitting: Emergency Medicine

## 2019-02-10 NOTE — Telephone Encounter (Signed)
Left voicemail checking in on patient, and encouraged return call with any continuing questions or concerns.    

## 2019-02-10 NOTE — Telephone Encounter (Signed)
Checked in on patient, discussed medications, and encouraged return call with any continuing questions or concerns.    

## 2019-03-15 NOTE — Progress Notes (Signed)
Greater than 5 minutes, yet less than 10 minutes of time have been spent researching, coordinating, and implementing care for this patient today.  Thank you for the details you included in the comment boxes. Those details are very helpful in determining the best course of treatment for you and help us to provide the best care.  

## 2020-12-24 ENCOUNTER — Ambulatory Visit
Admission: EM | Admit: 2020-12-24 | Discharge: 2020-12-24 | Disposition: A | Discharge status: Home / Self Care | Payer: Medicaid Other | Attending: Internal Medicine | Admitting: Internal Medicine

## 2020-12-24 ENCOUNTER — Ambulatory Visit: Payer: Medicaid Other

## 2020-12-24 ENCOUNTER — Other Ambulatory Visit: Payer: Self-pay

## 2020-12-24 ENCOUNTER — Encounter: Payer: Self-pay | Admitting: Emergency Medicine

## 2020-12-24 DIAGNOSIS — R350 Frequency of micturition: Secondary | ICD-10-CM | POA: Insufficient documentation

## 2020-12-24 DIAGNOSIS — Z113 Encounter for screening for infections with a predominantly sexual mode of transmission: Secondary | ICD-10-CM

## 2020-12-24 DIAGNOSIS — N3000 Acute cystitis without hematuria: Secondary | ICD-10-CM

## 2020-12-24 LAB — POCT URINALYSIS DIP (MANUAL ENTRY)
Bilirubin, UA: NEGATIVE
Blood, UA: NEGATIVE
Glucose, UA: NEGATIVE mg/dL
Ketones, POC UA: NEGATIVE mg/dL
Nitrite, UA: NEGATIVE
Protein Ur, POC: NEGATIVE mg/dL
Spec Grav, UA: 1.015 (ref 1.010–1.025)
Urobilinogen, UA: 0.2 E.U./dL
pH, UA: 7.5 (ref 5.0–8.0)

## 2020-12-24 LAB — POCT URINE PREGNANCY: Preg Test, Ur: NEGATIVE

## 2020-12-24 MED ORDER — NITROFURANTOIN MONOHYD MACRO 100 MG PO CAPS: 100.0000 mg | ORAL_CAPSULE | Freq: Two times a day (BID) | ORAL | 0 refills | Status: AC

## 2020-12-24 NOTE — ED Provider Notes (Addendum)
[facility]    CSN: [id number] Arrival date & time: 12/24/20  0901      History   Chief Complaint Chief Complaint  Patient presents with   SEXUALLY TRANSMITTED DISEASE   Urinary Frequency    HPI Misty Lopez is a 40 y.o. female.   Patient presents with 3 to 4-day history of urinary frequency and mild urinary burning.  Patient is also concerned for STDs and would like to be tested.  Patient reports that her significant other has not been faithful, and she is concerned that symptoms could be related to STDs.  Significant other does not have any similar symptoms or confirmed STD.  Denies any pelvic pain, fever, chills but does have some left lower back pain and left lower abdominal pain.  Patient has been having issues with fertility and has been attempting to get pregnant recently.  Patient is not currently on any of the medications for fertility at this time.  Last menstrual cycle was a few weeks prior.   Urinary Frequency   Past Medical History:  Diagnosis Date   Anemia     Patient Active Problem List   Diagnosis Date Noted   Depression with anxiety 01/05/2012    Past Surgical History:  Procedure Laterality Date   BREAST SURGERY     reduction   Open Myomectomy     WISDOM TOOTH EXTRACTION      OB History     Gravida  0   Para      Term      Preterm      AB      Living         SAB      IAB      Ectopic      Multiple      Live Births               Home Medications    Prior to Admission medications   Medication Sig Start Date End Date Taking? Authorizing Provider  Cholecalciferol (VITAMIN D3) 10000 UNITS TABS Take 2 tablets by mouth daily.   Yes [provider]  Ferrous Sulfate (IRON) 325 (65 FE) MG TABS Take 2 tablets by mouth daily.   Yes [provider]  ibuprofen (ADVIL,MOTRIN) 800 MG tablet Take 1 tablet (800 mg total) by mouth 3 (three) times daily. 06/16/18  Yes Burky, Malachy Moan, NP  nitrofurantoin,  macrocrystal-monohydrate, (MACROBID) 100 MG capsule Take 1 capsule (100 mg total) by mouth 2 (two) times daily for 7 days. 12/24/20 12/31/20 Yes Odis Luster, FNP  clindamycin-benzoyl peroxide (BENZACLIN) gel Apply topically 2 (two) times daily. 02/09/19   Hall-Potvin, [place], PA-C    Family History History reviewed. No pertinent family history.  Social History Social History   Tobacco Use   Smoking status: Never   Smokeless tobacco: Never  Substance Use Topics   Alcohol use: Yes    Comment: socially    Drug use: No     Allergies   Patient has no known allergies.   Review of Systems Review of Systems Per HPI  Physical Exam Triage Vital Signs ED Triage Vitals  Enc Vitals Group     BP 12/24/20 0948 134/78     Pulse Rate 12/24/20 0948 84     Resp 12/24/20 0948 20     Temp 12/24/20 0948 98.7 F (37.1 C)     Temp Source 12/24/20 0948 Oral     SpO2 12/24/20 0948 98 %  Weight --      Height --      Head Circumference --      Peak Flow --      Pain Score 12/24/20 0957 0     Pain Loc --      Pain Edu? --      Excl. in [place]? --    No data found.  Updated Vital Signs BP 134/78 (BP Location: Left Arm)   Pulse 84   Temp 98.7 F (37.1 C) (Oral)   Resp 20   SpO2 98%   Visual Acuity Right Eye Distance:   Left Eye Distance:   Bilateral Distance:    Right Eye Near:   Left Eye Near:    Bilateral Near:     Physical Exam Constitutional:      Appearance: Normal appearance.  HENT:     Head: Normocephalic and atraumatic.  Eyes:     Extraocular Movements: Extraocular movements intact.     Conjunctiva/sclera: Conjunctivae normal.  Cardiovascular:     Rate and Rhythm: Normal rate and regular rhythm.     Pulses: Normal pulses.     Heart sounds: Normal heart sounds.  Pulmonary:     Effort: Pulmonary effort is normal. No respiratory distress.     Breath sounds: Normal breath sounds.  Abdominal:     General: Abdomen is flat. Bowel sounds are normal. There is no  distension.     Palpations: Abdomen is soft.     Tenderness: There is no abdominal tenderness.  Genitourinary:    Comments: Deferred with shared decision making. Self swab performed. Musculoskeletal:     Cervical back: Normal.     Thoracic back: Normal.     Lumbar back: Normal.  Skin:    General: Skin is warm and dry.  Neurological:     General: No focal deficit present.     Mental Status: She is alert and oriented to person, place, and time. Mental status is at baseline.  Psychiatric:        Mood and Affect: Mood normal.        Behavior: Behavior normal.        Thought Content: Thought content normal.        Judgment: Judgment normal.     UC Treatments / Results  Labs (all labs ordered are listed, but only abnormal results are displayed) Labs Reviewed  POCT URINALYSIS DIP (MANUAL ENTRY) - Abnormal; Notable for the following components:      Result Value   Leukocytes, UA Trace (*)    All other components within normal limits  URINE CULTURE  POCT URINE PREGNANCY  CERVICOVAGINAL ANCILLARY ONLY    EKG   Radiology No results found.  Procedures Procedures (including critical care time)  Medications Ordered in UC Medications - No data to display  Initial Impression / Assessment and Plan / UC Course  I have reviewed the triage vital signs and the nursing notes.  Pertinent labs & imaging results that were available during my care of the patient were reviewed by me and considered in my medical decision making (see chart for details).     Urinalysis only showing trace leukocytes, so this is not definitive for urinary tract infection.  Will go ahead and treat for UTI as patient has typical UTI symptoms with Macrobid x7 days.  Urine culture and cervicovaginal swab are pending. Will change treatment if appropriate once results are complete.  Urine pregnant was negative.Discussed strict return precautions. Patient verbalized understanding and is  agreeable with plan.  Final  Clinical Impressions(s) / UC Diagnoses   Final diagnoses:  Urinary frequency  Screening examination for venereal disease  Acute cystitis without hematuria     Discharge Instructions      Your urine culture showed some signs of urinary tract infection.  You are being treated with Macrobid antibiotic to treat urinary tract infection.  Your vaginal swab and urine culture pending.  We will call if these are positive.  Urine pregnancy was negative.     ED Prescriptions     Medication Sig Dispense Auth. Provider   nitrofurantoin, macrocrystal-monohydrate, (MACROBID) 100 MG capsule Take 1 capsule (100 mg total) by mouth 2 (two) times daily for 7 days. 14 capsule Odis Luster, FNP      PDMP not reviewed this encounter.   Odis Luster, FNP 12/24/20 1041    Odis Luster, FNP 12/24/20 1056

## 2020-12-24 NOTE — Discharge Instructions (Signed)
Your urine culture showed some signs of urinary tract infection.  You are being treated with Macrobid antibiotic to treat urinary tract infection.  Your vaginal swab and urine culture pending.  We will call if these are positive.  Urine pregnancy was negative.

## 2020-12-24 NOTE — ED Triage Notes (Signed)
Found out a week ago her fiance cheated on her. Reports urinary frequency. Requesting STD check and UA

## 2020-12-26 LAB — CERVICOVAGINAL ANCILLARY ONLY
Bacterial Vaginitis (gardnerella): POSITIVE — AB
Candida Glabrata: NEGATIVE
Candida Vaginitis: NEGATIVE
Chlamydia: NEGATIVE
Comment: NEGATIVE
Comment: NEGATIVE
Comment: NEGATIVE
Comment: NEGATIVE
Comment: NEGATIVE
Comment: NORMAL
Neisseria Gonorrhea: NEGATIVE
Trichomonas: NEGATIVE

## 2020-12-26 LAB — URINE CULTURE: Culture: <10000 — AB

## 2020-12-28 ENCOUNTER — Telehealth (HOSPITAL_COMMUNITY): Payer: Self-pay

## 2020-12-28 MED ORDER — METRONIDAZOLE 500 MG PO TABS: 500.0000 mg | ORAL_TABLET | Freq: Two times a day (BID) | ORAL | 0 refills | Status: AC

## 2022-03-21 DIAGNOSIS — Z419 Encounter for procedure for purposes other than remedying health state, unspecified: Secondary | ICD-10-CM | POA: Diagnosis not present

## 2022-04-18 ENCOUNTER — Telehealth: Payer: Self-pay

## 2022-04-18 NOTE — Telephone Encounter (Signed)
LVM. Sending mychart msg. AS, CMA

## 2022-04-21 DIAGNOSIS — Z419 Encounter for procedure for purposes other than remedying health state, unspecified: Secondary | ICD-10-CM | POA: Diagnosis not present

## 2022-05-22 DIAGNOSIS — Z419 Encounter for procedure for purposes other than remedying health state, unspecified: Secondary | ICD-10-CM | POA: Diagnosis not present

## 2022-06-20 DIAGNOSIS — Z419 Encounter for procedure for purposes other than remedying health state, unspecified: Secondary | ICD-10-CM | POA: Diagnosis not present

## 2022-07-21 DIAGNOSIS — Z419 Encounter for procedure for purposes other than remedying health state, unspecified: Secondary | ICD-10-CM | POA: Diagnosis not present

## 2022-09-16 DIAGNOSIS — Z1231 Encounter for screening mammogram for malignant neoplasm of breast: Secondary | ICD-10-CM | POA: Diagnosis not present

## 2022-09-16 DIAGNOSIS — Z01419 Encounter for gynecological examination (general) (routine) without abnormal findings: Secondary | ICD-10-CM | POA: Diagnosis not present

## 2022-09-16 DIAGNOSIS — N951 Menopausal and female climacteric states: Secondary | ICD-10-CM | POA: Diagnosis not present

## 2022-09-16 DIAGNOSIS — Z13220 Encounter for screening for lipoid disorders: Secondary | ICD-10-CM | POA: Diagnosis not present

## 2022-09-16 DIAGNOSIS — D259 Leiomyoma of uterus, unspecified: Secondary | ICD-10-CM | POA: Diagnosis not present

## 2022-09-16 DIAGNOSIS — Z130 Encounter for screening for diseases of the blood and blood-forming organs and certain disorders involving the immune mechanism: Secondary | ICD-10-CM | POA: Diagnosis not present

## 2022-09-16 DIAGNOSIS — N926 Irregular menstruation, unspecified: Secondary | ICD-10-CM | POA: Diagnosis not present

## 2022-09-16 DIAGNOSIS — Z131 Encounter for screening for diabetes mellitus: Secondary | ICD-10-CM | POA: Diagnosis not present

## 2022-09-16 DIAGNOSIS — Z0001 Encounter for general adult medical examination with abnormal findings: Secondary | ICD-10-CM | POA: Diagnosis not present

## 2022-09-16 DIAGNOSIS — Z1321 Encounter for screening for nutritional disorder: Secondary | ICD-10-CM | POA: Diagnosis not present

## 2022-09-16 DIAGNOSIS — R7989 Other specified abnormal findings of blood chemistry: Secondary | ICD-10-CM | POA: Diagnosis not present

## 2022-09-16 DIAGNOSIS — Z1329 Encounter for screening for other suspected endocrine disorder: Secondary | ICD-10-CM | POA: Diagnosis not present

## 2022-09-18 DIAGNOSIS — Z6830 Body mass index (BMI) 30.0-30.9, adult: Secondary | ICD-10-CM | POA: Diagnosis not present

## 2022-09-18 DIAGNOSIS — E669 Obesity, unspecified: Secondary | ICD-10-CM | POA: Diagnosis not present

## 2022-09-18 DIAGNOSIS — Z713 Dietary counseling and surveillance: Secondary | ICD-10-CM | POA: Diagnosis not present

## 2022-09-24 DIAGNOSIS — N926 Irregular menstruation, unspecified: Secondary | ICD-10-CM | POA: Diagnosis not present

## 2022-09-24 DIAGNOSIS — D250 Submucous leiomyoma of uterus: Secondary | ICD-10-CM | POA: Diagnosis not present

## 2022-09-24 DIAGNOSIS — D252 Subserosal leiomyoma of uterus: Secondary | ICD-10-CM | POA: Diagnosis not present

## 2022-09-24 DIAGNOSIS — D259 Leiomyoma of uterus, unspecified: Secondary | ICD-10-CM | POA: Diagnosis not present

## 2022-09-24 DIAGNOSIS — N951 Menopausal and female climacteric states: Secondary | ICD-10-CM | POA: Diagnosis not present

## 2022-09-24 DIAGNOSIS — R7303 Prediabetes: Secondary | ICD-10-CM | POA: Diagnosis not present

## 2022-09-24 DIAGNOSIS — L680 Hirsutism: Secondary | ICD-10-CM | POA: Diagnosis not present

## 2022-09-30 DIAGNOSIS — Z1231 Encounter for screening mammogram for malignant neoplasm of breast: Secondary | ICD-10-CM | POA: Diagnosis not present

## 2022-10-09 DIAGNOSIS — R7989 Other specified abnormal findings of blood chemistry: Secondary | ICD-10-CM | POA: Diagnosis not present

## 2023-12-24 ENCOUNTER — Emergency Department (HOSPITAL_COMMUNITY)
Admission: EM | Admit: 2023-12-24 | Discharge: 2023-12-24 | Disposition: A | Discharge status: Home / Self Care | Attending: Emergency Medicine | Admitting: Emergency Medicine

## 2023-12-24 ENCOUNTER — Other Ambulatory Visit: Payer: Self-pay

## 2023-12-24 ENCOUNTER — Emergency Department (HOSPITAL_COMMUNITY)

## 2023-12-24 ENCOUNTER — Encounter (HOSPITAL_COMMUNITY): Payer: Self-pay | Admitting: *Deleted

## 2023-12-24 DIAGNOSIS — D72819 Decreased white blood cell count, unspecified: Secondary | ICD-10-CM | POA: Insufficient documentation

## 2023-12-24 DIAGNOSIS — M549 Dorsalgia, unspecified: Secondary | ICD-10-CM | POA: Diagnosis not present

## 2023-12-24 DIAGNOSIS — R079 Chest pain, unspecified: Secondary | ICD-10-CM | POA: Diagnosis present

## 2023-12-24 DIAGNOSIS — R0789 Other chest pain: Secondary | ICD-10-CM | POA: Insufficient documentation

## 2023-12-24 LAB — CBC
HCT: 40.3 % (ref 36.0–46.0)
Hemoglobin: 12.6 g/dL (ref 12.0–15.0)
MCH: 25.8 pg — ABNORMAL LOW (ref 26.0–34.0)
MCHC: 31.3 g/dL (ref 30.0–36.0)
MCV: 82.4 fL (ref 80.0–100.0)
Platelets: 228 K/uL (ref 150–400)
RBC: 4.89 MIL/uL (ref 3.87–5.11)
RDW: 14.5 % (ref 11.5–15.5)
WBC: 2.3 K/uL — ABNORMAL LOW (ref 4.0–10.5)
nRBC: 0 % (ref 0.0–0.2)

## 2023-12-24 LAB — BASIC METABOLIC PANEL WITH GFR
Anion gap: 11 (ref 5–15)
BUN: 11 mg/dL (ref 6–20)
CO2: 25 mmol/L (ref 22–32)
Calcium: 8.6 mg/dL — ABNORMAL LOW (ref 8.9–10.3)
Chloride: 102 mmol/L (ref 98–111)
Creatinine, Ser: 0.63 mg/dL (ref 0.44–1.00)
GFR, Estimated: >60 mL/min (ref >60)
Glucose, Bld: 97 mg/dL (ref 70–99)
Potassium: 4 mmol/L (ref 3.5–5.1)
Sodium: 138 mmol/L (ref 135–145)

## 2023-12-24 LAB — I-STAT CHEM 8, ED
BUN: 12 mg/dL (ref 6–20)
Calcium, Ion: 1.17 mmol/L (ref 1.15–1.40)
Chloride: 102 mmol/L (ref 98–111)
Creatinine, Ser: 0.8 mg/dL (ref 0.44–1.00)
Glucose, Bld: 100 mg/dL — ABNORMAL HIGH (ref 70–99)
HCT: 39 % (ref 36.0–46.0)
Hemoglobin: 13.3 g/dL (ref 12.0–15.0)
Potassium: 3.8 mmol/L (ref 3.5–5.1)
Sodium: 139 mmol/L (ref 135–145)
TCO2: 27 mmol/L (ref 22–32)

## 2023-12-24 LAB — TROPONIN T, HIGH SENSITIVITY
Troponin T High Sensitivity: <15 ng/L (ref 0–19)
Troponin T High Sensitivity: <15 ng/L (ref 0–19)

## 2023-12-24 MED ORDER — IOHEXOL 350 MG/ML SOLN
75.0000 mL | Freq: Once | INTRAVENOUS | Status: AC | PRN
  Administered 2023-12-24: 75 mL via INTRAVENOUS

## 2023-12-24 MED ORDER — KETOROLAC TROMETHAMINE 15 MG/ML IJ SOLN
15.0000 mg | Freq: Once | INTRAMUSCULAR | Status: AC
  Administered 2023-12-24: 15 mg via INTRAVENOUS
  Filled 2023-12-24: qty 1

## 2023-12-24 NOTE — ED Provider Notes (Signed)
 [place] [facility] AT [facility] Provider Note   CSN: [id number] Arrival date & time: 12/24/23  9587     Patient presents with: Chest Pain   Misty Lopez is a 44 y.o. female.   43 year old female presents with complaint of pain in her back and chest.  Patient states that the pain woke her from her sleep around 230 this morning with pain between her shoulder blades radiating through to her chest, now focused on the left side of her chest.  Initially felt like she had pulled a muscle but as she woke up became more uncomfortable which prompted her to come to the emergency room.  She denies associated nausea, diaphoresis, shortness of breath.  Denies past medical history of hypertension, hyperlipidemia, diabetes.  Is a non-smoker.  No family cardiac history.       Prior to Admission medications   Medication Sig Start Date End Date Taking? Authorizing Provider  Cholecalciferol (VITAMIN D3) 10000 UNITS TABS Take 2 tablets by mouth daily.    [provider]  clindamycin -benzoyl peroxide (BENZACLIN) gel Apply topically 2 (two) times daily. 02/09/19   Hall-Potvin, [place], PA-C  Ferrous Sulfate (IRON) 325 (65 FE) MG TABS Take 2 tablets by mouth daily.    [provider]  ibuprofen  (ADVIL ,MOTRIN ) 800 MG tablet Take 1 tablet (800 mg total) by mouth 3 (three) times daily. 06/16/18   Burky, Natalie B, NP  metroNIDAZOLE  (FLAGYL ) 500 MG tablet Take 1 tablet (500 mg total) by mouth 2 (two) times daily. 12/28/20   Lamptey, Aleene KIDD, MD    Allergies: Patient has no known allergies.    Review of Systems Negative except as per HPI Updated Vital Signs BP (!) 154/104 (BP Location: Right Arm)   Pulse 75   Temp 98.2 F (36.8 C) (Oral)   Resp 19   LMP  (LMP Unknown)   SpO2 100%   Physical Exam Vitals and nursing note reviewed.  Constitutional:      General: She is not in acute distress.    Appearance: She is well-developed. She is not diaphoretic.   HENT:     Head: Normocephalic and atraumatic.  Cardiovascular:     Rate and Rhythm: Normal rate and regular rhythm.     Heart sounds: Normal heart sounds.  Pulmonary:     Effort: Pulmonary effort is normal.     Breath sounds: Normal breath sounds.  Chest:     Chest wall: No tenderness.  Abdominal:     Palpations: Abdomen is soft.     Tenderness: There is no abdominal tenderness. There is no rebound.  Musculoskeletal:     Cervical back: Neck supple. No tenderness or bony tenderness.     Thoracic back: No tenderness or bony tenderness.     Right lower leg: No tenderness. No edema.     Left lower leg: No tenderness. No edema.  Skin:    General: Skin is warm and dry.     Findings: No erythema or rash.  Neurological:     Mental Status: She is alert and oriented to person, place, and time.  Psychiatric:        Behavior: Behavior normal.     (all labs ordered are listed, but only abnormal results are displayed) Labs Reviewed  CBC - Abnormal; Notable for the following components:      Result Value   WBC 2.3 (*)    MCH 25.8 (*)    All other components within normal limits  I-STAT  CHEM 8, ED - Abnormal; Notable for the following components:   Glucose, Bld 100 (*)    All other components within normal limits  BASIC METABOLIC PANEL WITH GFR  TROPONIN T, HIGH SENSITIVITY  TROPONIN T, HIGH SENSITIVITY    EKG: None  Radiology: CT Angio Chest Aorta w/CM &/OR wo/CM Result Date: 12/24/2023 EXAM: CTA CHEST AORTA 12/24/2023 05:46:47 AM TECHNIQUE: CTA of the chest was performed after the administration of intravenous contrast. Multiplanar reformatted images are provided for review. MIP images are provided for review. Automated exposure control, iterative reconstruction, and/or weight based adjustment of the mA/kV was utilized to reduce the radiation dose to as low as reasonably achievable. COMPARISON: None available. CLINICAL HISTORY: Acute aortic syndrome (AAS) suspected. Woke up from  her sleep (0230) with a pain in between her shoulder blades, through her left chest and left axilla area. FINDINGS: AORTA: No thoracic aortic dissection. No aneurysm. MEDIASTINUM: No mediastinal lymphadenopathy. The heart and pericardium demonstrate no acute abnormality. LYMPH NODES: No mediastinal, hilar or axillary lymphadenopathy. LUNGS AND PLEURA: Minimal dependent atelectasis within the lower lobes. No focal consolidation or pulmonary edema. No pleural effusion or pneumothorax. UPPER ABDOMEN: Limited images of the upper abdomen are unremarkable. SOFT TISSUES AND BONES: Sigmoid scoliosis of the thoracolumbar spine, with the upper thoracic curvature convex to the left. No acute bone or soft tissue abnormality. IMPRESSION: 1. No acute abnormality of the aorta. Electronically signed by: Evalene Coho MD 12/24/2023 05:53 AM EDT RP Workstation: [id number]   DG Chest Port 1 View Result Date: 12/24/2023 CLINICAL DATA:  Sudden onset chest pain. EXAM: PORTABLE CHEST 1 VIEW COMPARISON:  None. FINDINGS: There is artifact from overlying clothing and telemetry leads. The heart is slightly enlarged. No vascular congestion is seen. The lungs are clear. The sulci are sharp. The mediastinum unremarkable. Thoracic cage is intact with slight thoracic dextroscoliosis. IMPRESSION: No evidence of acute chest disease. Mild cardiomegaly. Electronically Signed   By: Francis Quam M.D.   On: 12/24/2023 05:13     Procedures   Medications Ordered in the ED  iohexol  (OMNIPAQUE ) 350 MG/ML injection 75 mL (75 mLs Intravenous Contrast Given 12/24/23 0538)                                    Medical Decision Making Amount and/or Complexity of Data Reviewed Labs: ordered. Radiology: ordered.  Risk Prescription drug management.   This patient presents to the ED for concern of back/CP, this involves an extensive number of treatment options, and is a complaint that carries with it a high risk of complications and morbidity.   The differential diagnosis includes but not limited to MI, dissection, arrhythmia, muscle strain, PE   Co morbidities / Chronic conditions that complicate the patient evaluation  anemia   Additional history obtained:  Additional history obtained from EMR External records from outside source obtained and reviewed including prior labs on file   Lab Tests:  I Ordered, and personally interpreted labs.  The pertinent results include: Leukopenia with WBC 2.23, downtrend compared to prior.  Recommend recheck with PCP.  I-STAT Chem-8 without significant findings.  Troponin and metabolic panel pending at time of signout to oncoming provider.   Imaging Studies ordered:  I ordered imaging studies including CXR, CTA chest  I independently visualized and interpreted imaging which showed no acute process, no evidence of dissection I agree with the radiologist interpretation   Cardiac Monitoring: /  EKG:  The patient was maintained on a cardiac monitor.  I personally viewed and interpreted the cardiac monitored which showed an underlying rhythm of: Sinus rhythm, rate 73   Problem List / ED Course / Critical interventions / Medication management  43 year old female presents with complaint of pain between her shoulder blades radiating to her chest to left side of chest, woke her from her sleep tonight.  Arrives in the ER and appears uncomfortable.  Pain is not reproduced with palpation. CT negative for dissection.  EKG without ischemia.  Care signed out at change of shift pending troponin x 2. I have reviewed the patients home medicines and have made adjustments as needed  Social Determinants of Health:  No PCP on file   Test / Admission - Considered:  Disposition pending at time of signout to oncoming provider      Final diagnoses:  Atypical chest pain  Leukopenia, unspecified type    ED Discharge Orders     None          Beverley Leita LABOR, PA-C 12/24/23 9385     Jerral Meth, MD 12/24/23 [phone]

## 2023-12-24 NOTE — ED Provider Notes (Signed)
 Accepted handoff at shift change from Leita Chancy PA-C. Please see prior provider note for more detail.   Briefly: Patient is 43 y.o.       Plan: FU on second troponin and likely DC home    Physical Exam  BP (!) 124/90 (BP Location: Right Arm)   Pulse 68   Temp 98.3 F (36.8 C) (Oral)   Resp 13   LMP  (LMP Unknown)   SpO2 100%   Physical Exam Vitals and nursing note reviewed.  Constitutional:      General: She is not in acute distress.    Appearance: Normal appearance. She is not ill-appearing.  HENT:     Head: Normocephalic and atraumatic.     Nose: Nose normal.  Eyes:     General: No scleral icterus.       Right eye: No discharge.        Left eye: No discharge.     Conjunctiva/sclera: Conjunctivae normal.  Cardiovascular:     Rate and Rhythm: Normal rate and regular rhythm.     Pulses: Normal pulses.     Heart sounds: Normal heart sounds.  Pulmonary:     Effort: Pulmonary effort is normal. No respiratory distress.     Breath sounds: No stridor. No wheezing.  Abdominal:     Palpations: Abdomen is soft.     Tenderness: There is no abdominal tenderness.  Musculoskeletal:     Cervical back: Normal range of motion.     Right lower leg: No edema.     Left lower leg: No edema.  Skin:    General: Skin is warm and dry.     Capillary Refill: Capillary refill takes less than 2 seconds.  Neurological:     Mental Status: She is alert and oriented to person, place, and time. Mental status is at baseline.  Psychiatric:        Mood and Affect: Mood normal.        Behavior: Behavior normal.     Procedures  Procedures  ED Course / MDM    Medical Decision Making Amount and/or Complexity of Data Reviewed Labs: ordered. Radiology: ordered.  Risk Prescription drug management.   I agree with prior provider assessment that patient is reasonable for outpatient follow-up.  Has a heart score of 1 as she is in the 45-64 category.  I recommend that she follow-up with  cardiology if persistent chest pain occurs.  Return to the emergency room for any new or concerning chest pain.  She has some improvement in her symptoms with Toradol  dissection study negative troponin x 2 normal normal vital signs well-appearing not dyspneic no hypoxia 100% on room air low suspicion for PE and reassured by negative dissection study.  Overall reassuring workup she does have some positional discomfort which may indicate pericarditis.  I believe that this patient has been sufficiently and thoughtfully evaluated here in the emergency department and she agrees with plan to follow-up outpatient.       Neldon Inoue [place], [place] 12/24/23 1045    Patsey Lot, MD 12/24/23 1459

## 2023-12-24 NOTE — ED Triage Notes (Signed)
 Pt says she woke up from her sleep (0230)  with a pain in between her shoulder blades, through her left chest and left axilla area.

## 2023-12-24 NOTE — Discharge Instructions (Signed)
 Return to the ER for worsening or concerning symptoms.  Follow-up with a primary care provider for recheck.  Your white blood cell count is low today.  If you do not have a primary care provider, please contact [place] and wellness.
